# Patient Record
Sex: Female | Born: 1995 | Race: White | Hispanic: No | Marital: Married | Smoking: Never smoker
Health system: Southern US, Community
[De-identification: ages and names within clinical notes are randomized; demographics above are authoritative.]

## PROBLEM LIST (undated history)

## (undated) DIAGNOSIS — D649 Anemia, unspecified: Secondary | ICD-10-CM

## (undated) DIAGNOSIS — O139 Gestational [pregnancy-induced] hypertension without significant proteinuria, unspecified trimester: Secondary | ICD-10-CM

## (undated) DIAGNOSIS — F419 Anxiety disorder, unspecified: Secondary | ICD-10-CM

## (undated) DIAGNOSIS — N189 Chronic kidney disease, unspecified: Secondary | ICD-10-CM

## (undated) HISTORY — DX: Anxiety disorder, unspecified: F41.9

## (undated) HISTORY — PX: WISDOM TOOTH EXTRACTION: SHX21

## (undated) HISTORY — DX: Gestational (pregnancy-induced) hypertension without significant proteinuria, unspecified trimester: O13.9

---

## 2018-02-06 NOTE — L&D Delivery Note (Signed)
Operative Delivery Note At 11:10 PM a viable female was delivered via Vaginal, Vacuum Neurosurgeon).  Presentation: vertex; Position: Left,, Occiput,, Anterior; Station: +3.  Pushed > 2 hours with good effort. Became exhausted and requested help from below. We discussed OVD vs Cesarean delivery and she elected VAVD.  Verbal consent: obtained from patient.  Risks and benefits discussed in detail.  Risks include, but are not limited to the risks of anesthesia, bleeding, infection, damage to maternal tissues, fetal cephalhematoma.  There is also the risk of inability to effect vaginal delivery of the head, or shoulder dystocia that cannot be resolved by established maneuvers, leading to the need for emergency cesarean section.  Vertex gently pulled over three contractions.   APGAR: 7, 8 Weight pending Placenta status: routine Cord: WNL with the following complications: None Cord pH: not sent  Anesthesia:  CLE Instruments: Kiwi Episiotomy: None Lacerations: 2nd degree;Perineal Suture Repair: 2.0 3.0 vicryl Est. Blood Loss (mL):  200cc   "Suzanne Shaffer"!!   Mom to postpartum.  Baby to Couplet care / Skin to Skin.  [name] 12/26/2018, 11:27 PM

## 2018-06-14 LAB — OB RESULTS CONSOLE GC/CHLAMYDIA
Chlamydia: NEGATIVE
Gonorrhea: NEGATIVE

## 2018-06-14 LAB — OB RESULTS CONSOLE ABO/RH: RH Type: POSITIVE

## 2018-06-14 LAB — OB RESULTS CONSOLE RPR: RPR: NONREACTIVE

## 2018-06-14 LAB — OB RESULTS CONSOLE HIV ANTIBODY (ROUTINE TESTING): HIV: NONREACTIVE

## 2018-06-14 LAB — OB RESULTS CONSOLE ANTIBODY SCREEN: Antibody Screen: NEGATIVE

## 2018-06-14 LAB — OB RESULTS CONSOLE RUBELLA ANTIBODY, IGM: Rubella: IMMUNE

## 2018-06-14 LAB — OB RESULTS CONSOLE HEPATITIS B SURFACE ANTIGEN: Hepatitis B Surface Ag: NEGATIVE

## 2018-09-05 ENCOUNTER — Encounter (HOSPITAL_COMMUNITY): Payer: Self-pay | Admitting: *Deleted

## 2018-09-05 ENCOUNTER — Other Ambulatory Visit: Payer: Self-pay

## 2018-09-05 ENCOUNTER — Inpatient Hospital Stay (HOSPITAL_BASED_OUTPATIENT_CLINIC_OR_DEPARTMENT_OTHER): Payer: BC Managed Care – PPO

## 2018-09-05 ENCOUNTER — Inpatient Hospital Stay (HOSPITAL_COMMUNITY)
Admission: AD | Admit: 2018-09-05 | Discharge: 2018-09-05 | Disposition: A | Discharge status: Home / Self Care | Payer: BC Managed Care – PPO | Attending: Obstetrics and Gynecology | Admitting: Obstetrics and Gynecology

## 2018-09-05 DIAGNOSIS — O26892 Other specified pregnancy related conditions, second trimester: Secondary | ICD-10-CM

## 2018-09-05 DIAGNOSIS — M545 Low back pain: Secondary | ICD-10-CM | POA: Diagnosis present

## 2018-09-05 DIAGNOSIS — Z3A21 21 weeks gestation of pregnancy: Secondary | ICD-10-CM

## 2018-09-05 DIAGNOSIS — O9989 Other specified diseases and conditions complicating pregnancy, childbirth and the puerperium: Secondary | ICD-10-CM

## 2018-09-05 DIAGNOSIS — O98812 Other maternal infectious and parasitic diseases complicating pregnancy, second trimester: Secondary | ICD-10-CM | POA: Diagnosis not present

## 2018-09-05 DIAGNOSIS — R42 Dizziness and giddiness: Secondary | ICD-10-CM | POA: Diagnosis not present

## 2018-09-05 DIAGNOSIS — B373 Candidiasis of vulva and vagina: Secondary | ICD-10-CM | POA: Insufficient documentation

## 2018-09-05 DIAGNOSIS — B3731 Acute candidiasis of vulva and vagina: Secondary | ICD-10-CM

## 2018-09-05 DIAGNOSIS — M549 Dorsalgia, unspecified: Secondary | ICD-10-CM | POA: Diagnosis not present

## 2018-09-05 DIAGNOSIS — O99891 Other specified diseases and conditions complicating pregnancy: Secondary | ICD-10-CM

## 2018-09-05 DIAGNOSIS — N898 Other specified noninflammatory disorders of vagina: Secondary | ICD-10-CM

## 2018-09-05 HISTORY — DX: Anemia, unspecified: D64.9

## 2018-09-05 HISTORY — DX: Chronic kidney disease, unspecified: N18.9

## 2018-09-05 LAB — URINALYSIS, ROUTINE W REFLEX MICROSCOPIC
Bilirubin Urine: NEGATIVE
Glucose, UA: 50 mg/dL — AB
Hgb urine dipstick: NEGATIVE
Ketones, ur: NEGATIVE mg/dL
Leukocytes,Ua: NEGATIVE
Nitrite: NEGATIVE
Protein, ur: NEGATIVE mg/dL
Specific Gravity, Urine: 1.024 (ref 1.005–1.030)
pH: 7 (ref 5.0–8.0)

## 2018-09-05 LAB — WET PREP, GENITAL
Clue Cells Wet Prep HPF POC: NONE SEEN
Sperm: NONE SEEN
Trich, Wet Prep: NONE SEEN
Yeast Wet Prep HPF POC: NONE SEEN

## 2018-09-05 MED ORDER — IBUPROFEN 600 MG PO TABS
600.0000 mg | ORAL_TABLET | Freq: Once | ORAL | Status: AC
  Administered 2018-09-05: 600 mg via ORAL
  Filled 2018-09-05: qty 1

## 2018-09-05 MED ORDER — IBUPROFEN 600 MG PO TABS: 600.0000 mg | ORAL_TABLET | Freq: Four times a day (QID) | ORAL | 0 refills | Status: DC | PRN

## 2018-09-05 MED ORDER — TERCONAZOLE 0.4 % VA CREA: 1.0000 | TOPICAL_CREAM | Freq: Every day | VAGINAL | 0 refills | Status: DC

## 2018-09-05 NOTE — MAU Provider Note (Signed)
Chief Complaint: Vaginal Discharge, Back Pain, and Dizziness   First Provider Initiated Contact with Patient 09/05/18 1901     SUBJECTIVE HPI: Suzanne Shaffer is a 23 y.o. G1P0 at 6231w4d who presents to Maternity Admissions reporting worsening low back pain that "takes her breath away" since 7/27 and increased white vaginal discharge today. Recently started a new job. Also had a episode of dizziness and seeing spots after standing up quickly at work today.    Location: mid low back Quality: aching Severity: 9/10 on pain scale Duration: few weeks Course: Much worse over the past 4 days Context: [redacted] weeks gestation. Just started a new job. Timing: constant Modifying factors: Nothing. Hasn't tried anything for the pain.  Associated signs and symptoms: Pos for vaginal discharge.  Negative for urinary complaints, leaking of fluid, vaginal bleeding, GI complaints, contractions or abdominal pain.  Past Medical History:  Diagnosis Date  . Anemia   . Chronic kidney disease    Frequent UTI's   OB History  Gravida Para Term Preterm AB Living  1            SAB TAB Ectopic Multiple Live Births               # Outcome Date GA Lbr Len/2nd Weight Sex Delivery Anes PTL Lv  1 Current            Past Surgical History:  Procedure Laterality Date  . WISDOM TOOTH EXTRACTION Bilateral    Upper only   Social History   Socioeconomic History  . Marital status: Single    Spouse name: Not on file  . Number of children: Not on file  . Years of education: Not on file  . Highest education level: Not on file  Occupational History  . Not on file  Social Needs  . Financial resource strain: Not on file  . Food insecurity    Worry: Not on file    Inability: Not on file  . Transportation needs    Medical: Not on file    Non-medical: Not on file  Tobacco Use  . Smoking status: Never Smoker  . Smokeless tobacco: Never Used  Substance and Sexual Activity  . Alcohol use: Not Currently    Comment:  Occasional  . Drug use: Never  . Sexual activity: Yes  Lifestyle  . Physical activity    Days per week: Not on file    Minutes per session: Not on file  . Stress: Not on file  Relationships  . Social Musicianconnections    Talks on phone: Not on file    Gets together: Not on file    Attends religious service: Not on file    Active member of club or organization: Not on file    Attends meetings of clubs or organizations: Not on file    Relationship status: Not on file  . Intimate partner violence    Fear of current or ex partner: Not on file    Emotionally abused: Not on file    Physically abused: Not on file    Forced sexual activity: Not on file  Other Topics Concern  . Not on file  Social History Narrative  . Not on file   Family History  Problem Relation Age of Onset  . Healthy Mother   . Healthy Father    No current facility-administered medications on file prior to encounter.    Current Outpatient Medications on File Prior to Encounter  Medication Sig Dispense Refill  .  Prenatal Vit-Fe Fumarate-FA (MULTIVITAMIN-PRENATAL) 27-0.8 MG TABS tablet Take 1 tablet by mouth daily at 12 noon.     Allergies  Allergen Reactions  . Sulfa Antibiotics     I have reviewed patient's Past Medical Hx, Surgical Hx, Family Hx, Social Hx, medications and allergies.   Review of Systems  Constitutional: Negative for chills and fever.  Gastrointestinal: Negative for abdominal pain, constipation, diarrhea, nausea and vomiting.  Genitourinary: Positive for vaginal discharge. Negative for difficulty urinating, dysuria, flank pain, frequency, hematuria, pelvic pain, urgency, vaginal bleeding and vaginal pain.  Musculoskeletal: Positive for back pain. Negative for myalgias.  Neurological: Positive for dizziness (Resolved spontaneously). Negative for syncope.    OBJECTIVE Patient Vitals for the past 24 hrs:  BP Temp Pulse Resp SpO2 Weight  09/05/18 1749 122/84 98.3 F (36.8 C) (!) 109 18 100 %  72.7 kg  09/05/18 1746 - - - - 100 % -   Constitutional: Well-developed, well-nourished female in no acute distress.  Skin: No pallor Cardiovascular: Mild tachycardia Respiratory: normal rate and effort.  GI: Abd soft, non-tender, gravid appropriate for gestational age. Neurologic: Alert and oriented x 4.  GU: Neg CVAT.  SPECULUM EXAM: NEFG, moderate amount of thick, white, odorless discharge, negative pooling, no blood noted, cervix clean, visually closed, normal ectropion.  BIMANUAL: cervix closed/thick; uterus 22-week size, no adnexal tenderness or masses.  No CMT.  LAB RESULTS Results for orders placed or performed during the hospital encounter of 09/05/18 (from the past 24 hour(s))  Urinalysis, Routine w reflex microscopic     Status: Abnormal   Collection Time: 09/05/18  5:53 PM  Result Value Ref Range   Color, Urine YELLOW YELLOW   APPearance HAZY (A) CLEAR   Specific Gravity, Urine 1.024 1.005 - 1.030   pH 7.0 5.0 - 8.0   Glucose, UA 50 (A) NEGATIVE mg/dL   Hgb urine dipstick NEGATIVE NEGATIVE   Bilirubin Urine NEGATIVE NEGATIVE   Ketones, ur NEGATIVE NEGATIVE mg/dL   Protein, ur NEGATIVE NEGATIVE mg/dL   Nitrite NEGATIVE NEGATIVE   Leukocytes,Ua NEGATIVE NEGATIVE  Wet prep, genital     Status: Abnormal   Collection Time: 09/05/18  7:20 PM   Specimen: Cervix; Genital  Result Value Ref Range   Yeast Wet Prep HPF POC NONE SEEN NONE SEEN   Trich, Wet Prep NONE SEEN NONE SEEN   Clue Cells Wet Prep HPF POC NONE SEEN NONE SEEN   WBC, Wet Prep HPF POC MANY (A) NONE SEEN   Sperm NONE SEEN     IMAGING Prelim: Fetal heart rate 154.  CL 4.12 cm, subjectively Nml fluid, ovaries Nml.   MAU COURSE Orders Placed This Encounter  Procedures  . Wet prep, genital  . US MFM OB TRANSVAGINAL  . US MFM OB Limited  . Urinalysis, Routine w reflex microscopic  . Apply heat to affected area  . POCT fern test   Meds ordered this encounter  Medications  . ibuprofen (ADVIL) tablet  600 mg   Pain improved w/ IBU.   MDM - LBP likely MS pain. No evidence of preterm labor, pyelo, kidney stones or emergent condition.    -Brief episode of dizziness upon standing likely orthostatic.  Encouraged to increase fluids and have small, frequent snacks.  -Clinical diagnosis of vaginal yeast infection.  Rx Terazol.  ASSESSMENT 1. Back pain affecting pregnancy in second trimester   2. Vaginal discharge during pregnancy in second trimester   3. Musculoskeletal back pain   4. Dizziness  5. Vaginal yeast infection     PLAN Discharge home in stable condition. Second trimester precautions Comfort measures, apply heat, stretches discussed.  IBU may be used sparingly and not after 28 weeks.  Follow-up Information    [place] Follow up on 09/16/2018.   Why: as scheduled or sooner as needed if symptoms worsen Contact information: [place] [address] [phone]        [facility] Follow up.   Specialty: Obstetrics and Gynecology Why: As needed in pregnancy emergencies Contact information: [address] [id number] [place] [phone]         Allergies as of 09/05/2018      Reactions   Sulfa Antibiotics       Medication List    TAKE these medications   ibuprofen 600 MG tablet Commonly known as: ADVIL Take 1 tablet (600 mg total) by mouth every 6 (six) hours as needed for moderate pain. Use sparingly.  Do not use after [redacted] weeks gestation.   multivitamin-prenatal 27-0.8 MG Tabs tablet Take 1 tablet by mouth daily at 12 noon.   terconazole 0.4 % vaginal cream Commonly known as: Terazol 7 Place 1 applicator vaginally at bedtime.        Tamala Julian, [place], [place] 09/05/2018  8:46 PM

## 2018-09-05 NOTE — Discharge Instructions (Signed)

## 2018-09-05 NOTE — MAU Note (Signed)
Been having severe back pain.  Started on Monday.  Has been increasing.  When she lays down, it just takes her breath away.  At work today, she got really dizzy. Like her blood sugar dropped.  Was seeing black spots.   Went to bathroom later, had a lot of d/c- much more then usual, again this time- but not as much.  Hx of anemia. Still doesn't feel quit right.

## 2018-10-10 ENCOUNTER — Other Ambulatory Visit: Payer: Self-pay

## 2018-10-10 DIAGNOSIS — Z20822 Contact with and (suspected) exposure to covid-19: Secondary | ICD-10-CM

## 2018-10-11 LAB — NOVEL CORONAVIRUS, NAA: SARS-CoV-2, NAA: DETECTED — AB

## 2018-12-09 ENCOUNTER — Other Ambulatory Visit: Payer: Self-pay

## 2018-12-09 ENCOUNTER — Inpatient Hospital Stay (HOSPITAL_COMMUNITY)
Admission: AD | Admit: 2018-12-09 | Discharge: 2018-12-09 | Disposition: A | Discharge status: Home / Self Care | Payer: BC Managed Care – PPO | Attending: Obstetrics and Gynecology | Admitting: Obstetrics and Gynecology

## 2018-12-09 ENCOUNTER — Encounter (HOSPITAL_COMMUNITY): Payer: Self-pay

## 2018-12-09 DIAGNOSIS — O26893 Other specified pregnancy related conditions, third trimester: Secondary | ICD-10-CM | POA: Diagnosis present

## 2018-12-09 DIAGNOSIS — R519 Headache, unspecified: Secondary | ICD-10-CM | POA: Diagnosis not present

## 2018-12-09 DIAGNOSIS — O99013 Anemia complicating pregnancy, third trimester: Secondary | ICD-10-CM | POA: Diagnosis not present

## 2018-12-09 DIAGNOSIS — Z3689 Encounter for other specified antenatal screening: Secondary | ICD-10-CM | POA: Insufficient documentation

## 2018-12-09 DIAGNOSIS — N189 Chronic kidney disease, unspecified: Secondary | ICD-10-CM | POA: Diagnosis not present

## 2018-12-09 DIAGNOSIS — O26833 Pregnancy related renal disease, third trimester: Secondary | ICD-10-CM | POA: Insufficient documentation

## 2018-12-09 DIAGNOSIS — I129 Hypertensive chronic kidney disease with stage 1 through stage 4 chronic kidney disease, or unspecified chronic kidney disease: Secondary | ICD-10-CM | POA: Diagnosis not present

## 2018-12-09 DIAGNOSIS — D649 Anemia, unspecified: Secondary | ICD-10-CM | POA: Insufficient documentation

## 2018-12-09 DIAGNOSIS — Z3A35 35 weeks gestation of pregnancy: Secondary | ICD-10-CM

## 2018-12-09 DIAGNOSIS — R030 Elevated blood-pressure reading, without diagnosis of hypertension: Secondary | ICD-10-CM | POA: Diagnosis not present

## 2018-12-09 LAB — COMPREHENSIVE METABOLIC PANEL
ALT: 15 U/L (ref 0–44)
AST: 16 U/L (ref 15–41)
Albumin: 2.8 g/dL — ABNORMAL LOW (ref 3.5–5.0)
Alkaline Phosphatase: 91 U/L (ref 38–126)
Anion gap: 10 (ref 5–15)
BUN: <5 mg/dL — ABNORMAL LOW (ref 6–20)
CO2: 21 mmol/L — ABNORMAL LOW (ref 22–32)
Calcium: 8.5 mg/dL — ABNORMAL LOW (ref 8.9–10.3)
Chloride: 105 mmol/L (ref 98–111)
Creatinine, Ser: 0.42 mg/dL — ABNORMAL LOW (ref 0.44–1.00)
GFR calc Af Amer: >60 mL/min (ref >60)
GFR calc non Af Amer: >60 mL/min (ref >60)
Glucose, Bld: 80 mg/dL (ref 70–99)
Potassium: 3.5 mmol/L (ref 3.5–5.1)
Sodium: 136 mmol/L (ref 135–145)
Total Bilirubin: 0.3 mg/dL (ref 0.3–1.2)
Total Protein: 6.4 g/dL — ABNORMAL LOW (ref 6.5–8.1)

## 2018-12-09 LAB — CBC
HCT: 33.7 % — ABNORMAL LOW (ref 36.0–46.0)
Hemoglobin: 10.7 g/dL — ABNORMAL LOW (ref 12.0–15.0)
MCH: 25.6 pg — ABNORMAL LOW (ref 26.0–34.0)
MCHC: 31.8 g/dL (ref 30.0–36.0)
MCV: 80.6 fL (ref 80.0–100.0)
Platelets: 237 10*3/uL (ref 150–400)
RBC: 4.18 MIL/uL (ref 3.87–5.11)
RDW: 15.7 % — ABNORMAL HIGH (ref 11.5–15.5)
WBC: 7.9 10*3/uL (ref 4.0–10.5)
nRBC: 0 % (ref 0.0–0.2)

## 2018-12-09 LAB — PROTEIN / CREATININE RATIO, URINE
Creatinine, Urine: 162.78 mg/dL
Protein Creatinine Ratio: 0.09 mg/mg{Cre} (ref 0.00–0.15)
Total Protein, Urine: 15 mg/dL

## 2018-12-09 LAB — URINALYSIS, ROUTINE W REFLEX MICROSCOPIC
Bilirubin Urine: NEGATIVE
Glucose, UA: 50 mg/dL — AB
Hgb urine dipstick: NEGATIVE
Ketones, ur: NEGATIVE mg/dL
Nitrite: NEGATIVE
Protein, ur: NEGATIVE mg/dL
Specific Gravity, Urine: 1.021 (ref 1.005–1.030)
pH: 5 (ref 5.0–8.0)

## 2018-12-09 MED ORDER — CYCLOBENZAPRINE HCL 10 MG PO TABS
10.0000 mg | ORAL_TABLET | Freq: Once | ORAL | Status: AC
  Administered 2018-12-09: 10 mg via ORAL
  Filled 2018-12-09: qty 1

## 2018-12-09 MED ORDER — CYCLOBENZAPRINE HCL 10 MG PO TABS: 10.0000 mg | ORAL_TABLET | Freq: Three times a day (TID) | ORAL | 0 refills | Status: DC | PRN

## 2018-12-09 NOTE — MAU Provider Note (Signed)
History     CSN: [id number]  Arrival date and time: 12/09/18 1340   First Provider Initiated Contact with Patient 12/09/18 1425      Chief Complaint  Patient presents with  . Hypertension  . Spots and/or Floaters   Ms. Suzanne Shaffer is a 23 y.o. G1P0 at [redacted]w[redacted]d who presents to MAU for preeclampsia evaluation after she experienced blurry vision and spots at work today. Pt reports her BP was checked at work today and it "wasn't normal for her" and was instructed by her OB to come to MAU for evaluation. Pt reports at work it was 140s/60s. Pt denies blurry vision at this time, but states that "if I move really fast I can see spots." Pt denies issues with elevated BP outside of pregnancy, but reports she did have an elevated BP at her last office visit for the first time. Pt unsure what the reading was at that visit. Pt reports before this occurred she ate eggs and PB crackers and apples. Pt reports she had two bottles of water this morning. Pt reports this is a normal amount of food/drink for her.  Pt reports she currently has a HA and rates it as 5/10. Pt reports she took Tylenol (one extra-strength tablet) today around 1000AM this morning, but reports this did not help her HA. Pt reports a HA of migraines as a child. Pt reports an increase in HA during this pregnancy.  Pt denies blurry vision/seeing spots, N/V, epigastric pain, swelling in face and hands, sudden weight gain. Pt denies chest pain and SOB.  Pt denies constipation, diarrhea, or urinary problems. Pt denies fever, chills, fatigue, sweating or changes in appetite. Pt denies dizziness, light-headedness, weakness.  Pt denies VB, ctx, LOF and reports good FM.  Current pregnancy problems? anemia, pt reports elevated BP at last visit Blood Type? unknown Allergies? sulfa Current medications? PNVs, Fe Current P[facility] & next appt? [facility], next appt 12/12/2018   OB History    Gravida  1   Para      Term      Preterm       AB      Living        SAB      TAB      Ectopic      Multiple      Live Births              Past Medical History:  Diagnosis Date  . Anemia   . Chronic kidney disease    Frequent UTI's    Past Surgical History:  Procedure Laterality Date  . WISDOM TOOTH EXTRACTION Bilateral    Upper only    Family History  Problem Relation Age of Onset  . Healthy Mother   . Healthy Father     Social History   Tobacco Use  . Smoking status: Never Smoker  . Smokeless tobacco: Never Used  Substance Use Topics  . Alcohol use: Not Currently    Comment: Occasional  . Drug use: Never    Allergies:  Allergies  Allergen Reactions  . Sulfa Antibiotics     Medications Prior to Admission  Medication Sig Dispense Refill Last Dose  . ferrous sulfate 325 (65 FE) MG tablet Take 325 mg by mouth daily with breakfast.   12/08/2018 at Unknown time  . Prenatal Vit-Fe Fumarate-FA (MULTIVITAMIN-PRENATAL) 27-0.8 MG TABS tablet Take 1 tablet by mouth daily at 12 noon.   12/08/2018 at Unknown time  . ibuprofen (  ADVIL) 600 MG tablet Take 1 tablet (600 mg total) by mouth every 6 (six) hours as needed for moderate pain. Use sparingly.  Do not use after [redacted] weeks gestation. 30 tablet 0   . terconazole (TERAZOL 7) 0.4 % vaginal cream Place 1 applicator vaginally at bedtime. 45 g 0     Review of Systems  Constitutional: Negative for chills, diaphoresis, fatigue and fever.  Eyes: Negative for visual disturbance.  Respiratory: Negative for shortness of breath.   Cardiovascular: Negative for chest pain.  Gastrointestinal: Negative for abdominal pain, constipation, diarrhea, nausea and vomiting.  Genitourinary: Negative for dysuria, flank pain, frequency, pelvic pain, urgency, vaginal bleeding and vaginal discharge.  Neurological: Positive for headaches. Negative for dizziness, weakness and light-headedness.   Physical Exam   Blood pressure 135/74, pulse 95, temperature 98.1 F (36.7 C),  temperature source Oral, resp. rate 17, height 5\' 7"  (1.702 m), weight 82.7 kg, SpO2 99 %.  Patient Vitals for the past 24 hrs:  BP Temp Temp src Pulse Resp SpO2 Height Weight  12/09/18 1530 135/74 - - 95 - - - -  12/09/18 1515 134/81 - - (!) 101 - - - -  12/09/18 1500 138/90 - - (!) 108 - - - -  12/09/18 1445 140/87 - - (!) 116 - - - -  12/09/18 1430 132/85 - - 98 - 99 % - -  12/09/18 1415 133/86 - - 99 - 98 % - -  12/09/18 1407 137/84 98.1 F (36.7 C) Oral (!) 101 17 - 5\' 7"  (1.702 m) 82.7 kg   Physical Exam  Constitutional: She is oriented to person, place, and time. She appears well-developed and well-nourished. No distress.  HENT:  Head: Normocephalic and atraumatic.  Respiratory: Effort normal.  GI: Soft. She exhibits no distension and no mass. There is no abdominal tenderness. There is no rebound and no guarding.  Neurological: She is alert and oriented to person, place, and time.  Skin: Skin is warm and dry. She is not diaphoretic.  Psychiatric: She has a normal mood and affect. Her behavior is normal. Judgment and thought content normal.   Results for orders placed or performed during the hospital encounter of 12/09/18 (from the past 24 hour(s))  Urinalysis, Routine w reflex microscopic     Status: Abnormal   Collection Time: 12/09/18  2:21 PM  Result Value Ref Range   Color, Urine YELLOW YELLOW   APPearance HAZY (A) CLEAR   Specific Gravity, Urine 1.021 1.005 - 1.030   pH 5.0 5.0 - 8.0   Glucose, UA 50 (A) NEGATIVE mg/dL   Hgb urine dipstick NEGATIVE NEGATIVE   Bilirubin Urine NEGATIVE NEGATIVE   Ketones, ur NEGATIVE NEGATIVE mg/dL   Protein, ur NEGATIVE NEGATIVE mg/dL   Nitrite NEGATIVE NEGATIVE   Leukocytes,Ua TRACE (A) NEGATIVE   RBC / HPF 0-5 0 - 5 RBC/hpf   WBC, UA 0-5 0 - 5 WBC/hpf   Bacteria, UA RARE (A) NONE SEEN   Squamous Epithelial / LPF 11-20 0 - 5   Mucus PRESENT   Protein / creatinine ratio, urine     Status: None   Collection Time: 12/09/18  2:23  PM  Result Value Ref Range   Creatinine, Urine 162.78 mg/dL   Total Protein, Urine 15 mg/dL   Protein Creatinine Ratio 0.09 0.00 - 0.15 mg/mg[Cre]  CBC     Status: Abnormal   Collection Time: 12/09/18  2:38 PM  Result Value Ref Range   WBC 7.9 4.0 -  10.5 K/uL   RBC 4.18 3.87 - 5.11 MIL/uL   Hemoglobin 10.7 (L) 12.0 - 15.0 g/dL   HCT 29.533.7 (L) 62.136.0 - 30.846.0 %   MCV 80.6 80.0 - 100.0 fL   MCH 25.6 (L) 26.0 - 34.0 pg   MCHC 31.8 30.0 - 36.0 g/dL   RDW 65.715.7 (H) 84.611.5 - 96.215.5 %   Platelets 237 150 - 400 K/uL   nRBC 0.0 0.0 - 0.2 %  Comprehensive metabolic panel     Status: Abnormal   Collection Time: 12/09/18  2:38 PM  Result Value Ref Range   Sodium 136 135 - 145 mmol/L   Potassium 3.5 3.5 - 5.1 mmol/L   Chloride 105 98 - 111 mmol/L   CO2 21 (L) 22 - 32 mmol/L   Glucose, Bld 80 70 - 99 mg/dL   BUN <5 (L) 6 - 20 mg/dL   Creatinine, Ser 9.520.42 (L) 0.44 - 1.00 mg/dL   Calcium 8.5 (L) 8.9 - 10.3 mg/dL   Total Protein 6.4 (L) 6.5 - 8.1 g/dL   Albumin 2.8 (L) 3.5 - 5.0 g/dL   AST 16 15 - 41 U/L   ALT 15 0 - 44 U/L   Alkaline Phosphatase 91 38 - 126 U/L   Total Bilirubin 0.3 0.3 - 1.2 mg/dL   GFR calc non Af Amer >60 >60 mL/min   GFR calc Af Amer >60 >60 mL/min   Anion gap 10 5 - 15   No results found.  MAU Course  Procedures  MDM -preeclampsia evaluation with current 5/10 HA and hx of migraines -no prenatal records scanned into chart from OB -highest BP in MAU 140/87, normal exam -UA: hazy/50GLU/trace leuks/rare bacteria, sending urine for culture -CBC: H/H 10.7/33.7 (pt reports is on oral iron), platelets 237 -CMP: no abnormalities requiring treatment, AST/ALT 16/15 -PCr: 0.09 -Flexeril 10mg  given for HA, pt reports HA now 0/10 -EFM: reactive       -baseline: 140/150       -variability: moderate       -accels: present, 15x15       -decels: absent       -TOCO: no ctx -pt discharged to home in stable condition  Orders Placed This Encounter  Procedures  . Urinalysis,  Routine w reflex microscopic    Standing Status:   Standing    Number of Occurrences:   1  . CBC    Standing Status:   Standing    Number of Occurrences:   1  . Comprehensive metabolic panel    Standing Status:   Standing    Number of Occurrences:   1  . Protein / creatinine ratio, urine    Standing Status:   Standing    Number of Occurrences:   1  . Discharge patient    Order Specific Question:   Discharge disposition    Answer:   01-Home or Self Care [1]    Order Specific Question:   Discharge patient date    Answer:   12/09/2018   Meds ordered this encounter  Medications  . cyclobenzaprine (FLEXERIL) tablet 10 mg  . cyclobenzaprine (FLEXERIL) 10 MG tablet    Sig: Take 1 tablet (10 mg total) by mouth 3 (three) times daily as needed.    Dispense:  30 tablet    Refill:  0    Order Specific Question:   Supervising Provider    Answer:   Adam PhenixARNOLD, JAMES G [[id number]]   Assessment and Plan   1. Transient  elevated blood pressure   2. [redacted] weeks gestation of pregnancy   3. NST (non-stress test) reactive   4. Pregnancy headache in third trimester    Allergies as of 12/09/2018      Reactions   Sulfa Antibiotics       Medication List    STOP taking these medications   ibuprofen 600 MG tablet Commonly known as: ADVIL     TAKE these medications   cyclobenzaprine 10 MG tablet Commonly known as: FLEXERIL Take 1 tablet (10 mg total) by mouth 3 (three) times daily as needed.   ferrous sulfate 325 (65 FE) MG tablet Take 325 mg by mouth daily with breakfast.   multivitamin-prenatal 27-0.8 MG Tabs tablet Take 1 tablet by mouth daily at 12 noon.   terconazole 0.4 % vaginal cream Commonly known as: Terazol 7 Place 1 applicator vaginally at bedtime.      -will call with culture results, if positive -RX Flexeril -pt to have BP checked at appt on Thursday 12/12/2018, pt strongly advised to keep appt -strict preeclampsia/return MAU precautions given -pt discharged to home in stable  condition  Joni Reining E Vonita Calloway 12/09/2018, 3:49 PM

## 2018-12-09 NOTE — Discharge Instructions (Signed)
Preterm Labor and Birth Information ° °The normal length of a pregnancy is 39-41 weeks. Preterm labor is when labor starts before 37 completed weeks of pregnancy. °What are the risk factors for preterm labor? °Preterm labor is more likely to occur in women who: °· Have certain infections during pregnancy such as a bladder infection, sexually transmitted infection, or infection inside the uterus (chorioamnionitis). °· Have a shorter-than-normal cervix. °· Have gone into preterm labor before. °· Have had surgery on their cervix. °· Are younger than age 17 or older than age 35. °· Are African American. °· Are pregnant with twins or multiple babies (multiple gestation). °· Take street drugs or smoke while pregnant. °· Do not gain enough weight while pregnant. °· Became pregnant shortly after having been pregnant. °What are the symptoms of preterm labor? °Symptoms of preterm labor include: °· Cramps similar to those that can happen during a menstrual period. The cramps may happen with diarrhea. °· Pain in the abdomen or lower back. °· Regular uterine contractions that may feel like tightening of the abdomen. °· A feeling of increased pressure in the pelvis. °· Increased watery or bloody mucus discharge from the vagina. °· Water breaking (ruptured amniotic sac). °Why is it important to recognize signs of preterm labor? °It is important to recognize signs of preterm labor because babies who are born prematurely may not be fully developed. This can put them at an increased risk for: °· Long-term (chronic) heart and lung problems. °· Difficulty immediately after birth with regulating body systems, including blood sugar, body temperature, heart rate, and breathing rate. °· Bleeding in the brain. °· Cerebral palsy. °· Learning difficulties. °· Death. °These risks are highest for babies who are born before 34 weeks of pregnancy. °How is preterm labor treated? °Treatment depends on the length of your pregnancy, your condition,  and the health of your baby. It may involve: °· Having a stitch (suture) placed in your cervix to prevent your cervix from opening too early (cerclage). °· Taking or being given medicines, such as: °? Hormone medicines. These may be given early in pregnancy to help support the pregnancy. °? Medicine to stop contractions. °? Medicines to help mature the baby’s lungs. These may be prescribed if the risk of delivery is high. °? Medicines to prevent your baby from developing cerebral palsy. °If the labor happens before 34 weeks of pregnancy, you may need to stay in the hospital. °What should I do if I think I am in preterm labor? °If you think that you are going into preterm labor, call your health care provider right away. °How can I prevent preterm labor in future pregnancies? °To increase your chance of having a full-term pregnancy: °· Do not use any tobacco products, such as cigarettes, chewing tobacco, and e-cigarettes. If you need help quitting, ask your health care provider. °· Do not use street drugs or medicines that have not been prescribed to you during your pregnancy. °· Talk with your health care provider before taking any herbal supplements, even if you have been taking them regularly. °· Make sure you gain a healthy amount of weight during your pregnancy. °· Watch for infection. If you think that you might have an infection, get it checked right away. °· Make sure to tell your health care provider if you have gone into preterm labor before. °This information is not intended to replace advice given to you by your health care provider. Make sure you discuss any questions you have with your   health care provider. °Document Released: 04/15/2003 Document Revised: 05/17/2018 Document Reviewed: 06/16/2015 °Elsevier Patient Education © 2020 [employer] ° °Preeclampsia and Eclampsia °Preeclampsia is a serious condition that may develop during pregnancy. This condition causes high blood pressure and increased  protein in your urine along with other symptoms, such as headaches and vision changes. These symptoms may develop as the condition gets worse. Preeclampsia may occur at 20 weeks of pregnancy or later. °Diagnosing and treating preeclampsia early is very important. If not treated early, it can cause serious problems for you and your baby. One problem it can lead to is eclampsia. Eclampsia is a condition that causes muscle jerking or shaking (convulsions or seizures) and other serious problems for the mother. During pregnancy, delivering your baby may be the best treatment for preeclampsia or eclampsia. For most women, preeclampsia and eclampsia symptoms go away after giving birth. °In rare cases, a woman may develop preeclampsia after giving birth (postpartum preeclampsia). This usually occurs within 48 hours after childbirth but may occur up to 6 weeks after giving birth. °What are the causes? °The cause of preeclampsia is not known. °What increases the risk? °The following risk factors make you more likely to develop preeclampsia: °· Being pregnant for the first time. °· Having had preeclampsia during a past pregnancy. °· Having a family history of preeclampsia. °· Having high blood pressure. °· Being pregnant with more than one baby. °· Being 35 or older. °· Being African-American. °· Having kidney disease or diabetes. °· Having medical conditions such as lupus or blood diseases. °· Being very overweight (obese). °What are the signs or symptoms? °The most common symptoms are: °· Severe headaches. °· Vision problems, such as blurred or double vision. °· Abdominal pain, especially upper abdominal pain. °Other symptoms that may develop as the condition gets worse include: °· Sudden weight gain. °· Sudden swelling of the hands, face, legs, and feet. °· Severe nausea and vomiting. °· Numbness in the face, arms, legs, and feet. °· Dizziness. °· Urinating less than usual. °· Slurred speech. °· Convulsions or  seizures. °How is this diagnosed? °There are no screening tests for preeclampsia. Your health care provider will ask you about symptoms and check for signs of preeclampsia during your prenatal visits. You may also have tests that include: °· Checking your blood pressure. °· Urine tests to check for protein. Your health care provider will check for this at every prenatal visit. °· Blood tests. °· Monitoring your baby's heart rate. °· Ultrasound. °How is this treated? °You and your health care provider will determine the treatment approach that is best for you. Treatment may include: °· Having more frequent prenatal exams to check for signs of preeclampsia, if you have an increased risk for preeclampsia. °· Medicine to lower your blood pressure. °· Staying in the hospital, if your condition is severe. There, treatment will focus on controlling your blood pressure and the amount of fluids in your body (fluid retention). °· Taking medicine (magnesium sulfate) to prevent seizures. This may be given as an injection or through an IV. °· Taking a low-dose aspirin during your pregnancy. °· Delivering your baby early. You may have your labor started with medicine (induced), or you may have a cesarean delivery. °Follow these instructions at home: °Eating and drinking ° °· Drink enough fluid to keep your urine pale yellow. °· Avoid caffeine. °Lifestyle °· Do not use any products that contain nicotine or tobacco, such as cigarettes and e-cigarettes. If you need help quitting, ask   health care provider.  Do not use alcohol or drugs.  Avoid stress as much as possible. Rest and get plenty of sleep. General instructions  Take over-the-counter and prescription medicines only as told by your health care provider.  When lying down, lie on your left side. This keeps pressure off your major blood vessels.  When sitting or lying down, raise (elevate) your feet. Try putting some pillows underneath your lower  legs.  Exercise regularly. Ask your health care provider what kinds of exercise are best for you.  Keep all follow-up and prenatal visits as told by your health care provider. This is important. How is this prevented? There is no known way of preventing preeclampsia or eclampsia from developing. However, to lower your risk of complications and detect problems early:  Get regular prenatal care. Your health care provider may be able to diagnose and treat the condition early.  Maintain a healthy weight. Ask your health care provider for help managing weight gain during pregnancy.  Work with your health care provider to manage any long-term (chronic) health conditions you have, such as diabetes or kidney problems.  You may have tests of your blood pressure and kidney function after giving birth.  Your health care provider may have you take low-dose aspirin during your next pregnancy. Contact a health care provider if:  You have symptoms that your health care provider told you may require more treatment or monitoring, such as: ? Headaches. ? Nausea or vomiting. ? Abdominal pain. ? Dizziness. ? Light-headedness. Get help right away if:  You have severe: ? Abdominal pain. ? Headaches that do not get better. ? Dizziness. ? Vision problems. ? Confusion. ? Nausea or vomiting.  You have any of the following: ? A seizure. ? Sudden, rapid weight gain. ? Sudden swelling in your hands, ankles, or face. ? Trouble moving any part of your body. ? Numbness in any part of your body. ? Trouble speaking. ? Abnormal bleeding.  You faint. Summary  Preeclampsia is a serious condition that may develop during pregnancy.  This condition causes high blood pressure and increased protein in your urine along with other symptoms, such as headaches and vision changes.  Diagnosing and treating preeclampsia early is very important. If not treated early, it can cause serious problems for you and your  baby.  Get help right away if you have symptoms that your health care provider told you to watch for. This information is not intended to replace advice given to you by your health care provider. Make sure you discuss any questions you have with your health care provider. Document Released: 01/21/2000 Document Revised: 09/25/2017 Document Reviewed: 08/30/2015 Elsevier Patient Education  2020 [employer].

## 2018-12-09 NOTE — MAU Note (Signed)
.  Suzanne Shaffer is a 23 y.o. at [redacted]w[redacted]d here in MAU reporting: elevated BP at work at 1300 with floaters in vision. She also reports swelling and a HA that is 6/10 after tylenol. No VB or LOF and feels fetal movement.  BP: 137/84 FHT:145

## 2018-12-12 LAB — OB RESULTS CONSOLE GBS: GBS: NEGATIVE

## 2018-12-23 ENCOUNTER — Telehealth (HOSPITAL_COMMUNITY): Payer: Self-pay | Admitting: *Deleted

## 2018-12-23 ENCOUNTER — Encounter (HOSPITAL_COMMUNITY): Payer: Self-pay | Admitting: *Deleted

## 2018-12-23 NOTE — Telephone Encounter (Signed)
Preadmission screen  

## 2018-12-24 ENCOUNTER — Other Ambulatory Visit (HOSPITAL_COMMUNITY): Payer: BC Managed Care – PPO

## 2018-12-25 NOTE — H&P (Signed)
Suzanne Shaffer is a 23 y.o. female presenting for IOL s/s gestational HTN w/out severe features. Pregnancy uncomplicated otherwise. Expecting a girl - "Suzanne Shaffer"!!   OB History    Gravida  1   Para      Term      Preterm      AB      Living        SAB      TAB      Ectopic      Multiple      Live Births             Past Medical History:  Diagnosis Date  . Anemia   . Anxiety   . Chronic kidney disease    Frequent UTI's  . Pregnancy induced hypertension    Past Surgical History:  Procedure Laterality Date  . Shaffer TOOTH EXTRACTION Bilateral    Upper only   Family History: family history includes Healthy in her father and mother. Social History:  reports that she has never smoked. She has never used smokeless tobacco. She reports previous alcohol use. She reports that she does not use drugs.     Maternal Diabetes: No Genetic Screening: Normal Maternal Ultrasounds/Referrals: Normal Fetal Ultrasounds or other Referrals:  None Maternal Substance Abuse:  No Significant Maternal Medications:  None Significant Maternal Lab Results:  None Other Comments:  None  ROS History   There were no vitals taken for this visit. Exam Physical Exam  (from office) NAD, A&O NWOB Abd soft, nondistended, gravid SVE 1/60/-2  Prenatal labs: ABO, Rh: A/Positive/-- (05/08 0000) Antibody: Negative (05/08 0000) Rubella: Immune (05/08 0000) RPR: Nonreactive (05/08 0000)  HBsAg: Negative (05/08 0000)  HIV: Non-reactive (05/08 0000)  GBS: Negative/-- (11/05 0000)   Assessment/Plan: 23 yo G1P0 @ 37.4 wga presenting for IOL s/s PIH w/out severe features. Cervix unfavorable. Plan for cytotec followed by pitocin/AROM when more favorable.  No severe features - Mag prn severe features.  GBS negative.     Colin Benton Seraj Dunnam 12/25/2018, 1:29 PM

## 2018-12-26 ENCOUNTER — Inpatient Hospital Stay (HOSPITAL_COMMUNITY)
Admission: AD | Admit: 2018-12-26 | Discharge: 2018-12-28 | DRG: 807 | Disposition: A | Discharge status: Home / Self Care | Payer: BC Managed Care – PPO | Attending: Obstetrics and Gynecology | Admitting: Obstetrics and Gynecology

## 2018-12-26 ENCOUNTER — Other Ambulatory Visit: Payer: Self-pay

## 2018-12-26 ENCOUNTER — Inpatient Hospital Stay (HOSPITAL_COMMUNITY): Payer: BC Managed Care – PPO

## 2018-12-26 ENCOUNTER — Inpatient Hospital Stay (HOSPITAL_COMMUNITY): Payer: BC Managed Care – PPO | Admitting: Anesthesiology

## 2018-12-26 ENCOUNTER — Encounter (HOSPITAL_COMMUNITY): Payer: Self-pay

## 2018-12-26 DIAGNOSIS — O134 Gestational [pregnancy-induced] hypertension without significant proteinuria, complicating childbirth: Secondary | ICD-10-CM | POA: Diagnosis present

## 2018-12-26 DIAGNOSIS — Z3A37 37 weeks gestation of pregnancy: Secondary | ICD-10-CM | POA: Diagnosis not present

## 2018-12-26 DIAGNOSIS — O9902 Anemia complicating childbirth: Secondary | ICD-10-CM | POA: Diagnosis present

## 2018-12-26 DIAGNOSIS — D649 Anemia, unspecified: Secondary | ICD-10-CM | POA: Diagnosis present

## 2018-12-26 DIAGNOSIS — Z3490 Encounter for supervision of normal pregnancy, unspecified, unspecified trimester: Secondary | ICD-10-CM

## 2018-12-26 LAB — CBC
HCT: 33.8 % — ABNORMAL LOW (ref 36.0–46.0)
HCT: 33.3 % — ABNORMAL LOW (ref 36.0–46.0)
Hemoglobin: 10.6 g/dL — ABNORMAL LOW (ref 12.0–15.0)
Hemoglobin: 10.7 g/dL — ABNORMAL LOW (ref 12.0–15.0)
MCH: 25.3 pg — ABNORMAL LOW (ref 26.0–34.0)
MCH: 25.7 pg — ABNORMAL LOW (ref 26.0–34.0)
MCHC: 31.4 g/dL (ref 30.0–36.0)
MCHC: 32.1 g/dL (ref 30.0–36.0)
MCV: 80.7 fL (ref 80.0–100.0)
MCV: 79.9 fL — ABNORMAL LOW (ref 80.0–100.0)
Platelets: 241 10*3/uL (ref 150–400)
Platelets: 153 10*3/uL (ref 150–400)
RBC: 4.19 MIL/uL (ref 3.87–5.11)
RBC: 4.17 MIL/uL (ref 3.87–5.11)
RDW: 15.7 % — ABNORMAL HIGH (ref 11.5–15.5)
RDW: 15.8 % — ABNORMAL HIGH (ref 11.5–15.5)
WBC: 8.2 10*3/uL (ref 4.0–10.5)
WBC: 8.3 10*3/uL (ref 4.0–10.5)
nRBC: 0 % (ref 0.0–0.2)
nRBC: 0 % (ref 0.0–0.2)

## 2018-12-26 LAB — TYPE AND SCREEN
ABO/RH(D): A POS
Antibody Screen: NEGATIVE

## 2018-12-26 LAB — RPR: RPR Ser Ql: NONREACTIVE

## 2018-12-26 LAB — ABO/RH: ABO/RH(D): A POS

## 2018-12-26 MED ORDER — TERBUTALINE SULFATE 1 MG/ML IJ SOLN: 0.2500 mg | Freq: Once | INTRAMUSCULAR | Status: DC | PRN

## 2018-12-26 MED ORDER — EPHEDRINE 5 MG/ML INJ: 10.0000 mg | INTRAVENOUS | Status: DC | PRN

## 2018-12-26 MED ORDER — FENTANYL-BUPIVACAINE-NACL 0.5-0.125-0.9 MG/250ML-% EP SOLN: 12.0000 mL/h | EPIDURAL | Status: DC | PRN

## 2018-12-26 MED ORDER — LACTATED RINGERS IV SOLN: 500.0000 mL | INTRAVENOUS | Status: DC | PRN

## 2018-12-26 MED ORDER — LACTATED RINGERS IV SOLN
500.0000 mL | Freq: Once | INTRAVENOUS | Status: AC
  Administered 2018-12-26: 500 mL via INTRAVENOUS

## 2018-12-26 MED ORDER — HYDROXYZINE HCL 50 MG PO TABS: 50.0000 mg | ORAL_TABLET | Freq: Four times a day (QID) | ORAL | Status: DC | PRN

## 2018-12-26 MED ORDER — ZOLPIDEM TARTRATE 5 MG PO TABS: 5.0000 mg | ORAL_TABLET | Freq: Every evening | ORAL | Status: DC | PRN

## 2018-12-26 MED ORDER — FENTANYL-BUPIVACAINE-NACL 0.5-0.125-0.9 MG/250ML-% EP SOLN
EPIDURAL | Status: AC
  Filled 2018-12-26: qty 250

## 2018-12-26 MED ORDER — OXYCODONE-ACETAMINOPHEN 5-325 MG PO TABS: 1.0000 | ORAL_TABLET | ORAL | Status: DC | PRN

## 2018-12-26 MED ORDER — PHENYLEPHRINE 40 MCG/ML (10ML) SYRINGE FOR IV PUSH (FOR BLOOD PRESSURE SUPPORT): 80.0000 ug | PREFILLED_SYRINGE | INTRAVENOUS | Status: DC | PRN

## 2018-12-26 MED ORDER — OXYCODONE-ACETAMINOPHEN 5-325 MG PO TABS: 2.0000 | ORAL_TABLET | ORAL | Status: DC | PRN

## 2018-12-26 MED ORDER — LACTATED RINGERS IV SOLN
INTRAVENOUS | Status: DC
  Administered 2018-12-26 (×3): via INTRAVENOUS

## 2018-12-26 MED ORDER — BUTORPHANOL TARTRATE 1 MG/ML IJ SOLN: 1.0000 mg | INTRAMUSCULAR | Status: DC | PRN

## 2018-12-26 MED ORDER — OXYTOCIN 40 UNITS IN NORMAL SALINE INFUSION - SIMPLE MED
2.5000 [IU]/h | INTRAVENOUS | Status: DC
  Administered 2018-12-26: 2.5 [IU]/h via INTRAVENOUS

## 2018-12-26 MED ORDER — ACETAMINOPHEN 325 MG PO TABS: 650.0000 mg | ORAL_TABLET | ORAL | Status: DC | PRN

## 2018-12-26 MED ORDER — LACTATED RINGERS IV SOLN: 500.0000 mL | Freq: Once | INTRAVENOUS | Status: DC

## 2018-12-26 MED ORDER — SODIUM CHLORIDE (PF) 0.9 % IJ SOLN
INTRAMUSCULAR | Status: DC | PRN
  Administered 2018-12-26: 12 mL/h via EPIDURAL

## 2018-12-26 MED ORDER — DIPHENHYDRAMINE HCL 50 MG/ML IJ SOLN: 12.5000 mg | INTRAMUSCULAR | Status: DC | PRN

## 2018-12-26 MED ORDER — MISOPROSTOL 25 MCG QUARTER TABLET
25.0000 ug | ORAL_TABLET | ORAL | Status: DC | PRN
  Administered 2018-12-26 (×2): 25 ug via VAGINAL
  Filled 2018-12-26 (×2): qty 1

## 2018-12-26 MED ORDER — SOD CITRATE-CITRIC ACID 500-334 MG/5ML PO SOLN: 30.0000 mL | ORAL | Status: DC | PRN

## 2018-12-26 MED ORDER — OXYTOCIN 40 UNITS IN NORMAL SALINE INFUSION - SIMPLE MED
1.0000 m[IU]/min | INTRAVENOUS | Status: DC
  Filled 2018-12-26: qty 1000

## 2018-12-26 MED ORDER — LIDOCAINE HCL (PF) 1 % IJ SOLN
INTRAMUSCULAR | Status: DC | PRN
  Administered 2018-12-26: 6 mL via EPIDURAL

## 2018-12-26 MED ORDER — OXYTOCIN BOLUS FROM INFUSION
500.0000 mL | Freq: Once | INTRAVENOUS | Status: AC
  Administered 2018-12-26: 500 mL via INTRAVENOUS

## 2018-12-26 MED ORDER — LIDOCAINE HCL (PF) 1 % IJ SOLN: 30.0000 mL | INTRAMUSCULAR | Status: DC | PRN

## 2018-12-26 MED ORDER — ONDANSETRON HCL 4 MG/2ML IJ SOLN: 4.0000 mg | Freq: Four times a day (QID) | INTRAMUSCULAR | Status: DC | PRN

## 2018-12-26 NOTE — Progress Notes (Signed)
Doing well - feeling cramping with contractions. Wants CLE as soon as possible. SVE 2/50/-2, AROM for clear fluid No current severe features of PIH Start pitocin 4 hours after last cytotec.    Arty Baumgartner MD

## 2018-12-26 NOTE — Progress Notes (Signed)
7/100/-1 Pit at 8

## 2018-12-26 NOTE — Progress Notes (Signed)
C/c/+1 Push

## 2018-12-26 NOTE — Anesthesia Procedure Notes (Signed)
Epidural Patient location during procedure: OB Start time: 12/26/2018 9:06 AM End time: 12/26/2018 9:11 AM  Staffing Anesthesiologist: Janeece Riggers, MD  Preanesthetic Checklist Completed: patient identified, site marked, surgical consent, pre-op evaluation, timeout performed, IV checked, risks and benefits discussed and monitors and equipment checked  Epidural Patient position: sitting Prep: site prepped and draped and DuraPrep Patient monitoring: continuous pulse ox and blood pressure Approach: midline Location: L3-L4 Injection technique: LOR air  Needle:  Needle type: Tuohy  Needle gauge: 17 G Needle length: 9 cm and 9 Needle insertion depth: 5 cm cm Catheter type: closed end flexible Catheter size: 19 Gauge Catheter at skin depth: 11 cm Test dose: negative  Assessment Events: blood not aspirated, injection not painful, no injection resistance, negative IV test and no paresthesia

## 2018-12-26 NOTE — Progress Notes (Signed)
SVE 4/80/-2 IUPC placed 

## 2018-12-26 NOTE — Progress Notes (Signed)
Doing well IUPC adequate

## 2018-12-26 NOTE — Anesthesia Preprocedure Evaluation (Signed)
Anesthesia Evaluation  Patient identified by MRN, date of birth, ID band Patient awake    Reviewed: Allergy & Precautions, H&P , NPO status , Patient's Chart, lab work & pertinent test results, reviewed documented beta blocker date and time   Airway Mallampati: I  TM Distance: >3 FB Neck ROM: full    Dental no notable dental hx. (+) Teeth Intact, Dental Advisory Given   Pulmonary neg pulmonary ROS,    Pulmonary exam normal breath sounds clear to auscultation       Cardiovascular hypertension, negative cardio ROS Normal cardiovascular exam Rhythm:regular Rate:Normal     Neuro/Psych negative neurological ROS  negative psych ROS   GI/Hepatic negative GI ROS, Neg liver ROS,   Endo/Other  negative endocrine ROS  Renal/GU negative Renal ROS  negative genitourinary   Musculoskeletal   Abdominal   Peds  Hematology  (+) Blood dyscrasia, anemia ,   Anesthesia Other Findings   Reproductive/Obstetrics (+) Pregnancy                             Anesthesia Physical Anesthesia Plan  ASA: III  Anesthesia Plan: Epidural   Post-op Pain Management:    Induction:   PONV Risk Score and Plan:   Airway Management Planned:   Additional Equipment:   Intra-op Plan:   Post-operative Plan:   Informed Consent: I have reviewed the patients History and Physical, chart, labs and discussed the procedure including the risks, benefits and alternatives for the proposed anesthesia with the patient or authorized representative who has indicated his/her understanding and acceptance.     Dental Advisory Given  Plan Discussed with: Anesthesiologist and CRNA  Anesthesia Plan Comments: (Labs checked- platelets confirmed with RN in room. Fetal heart tracing, per RN, reported to be stable enough for sitting procedure. Discussed epidural, and patient consents to the procedure:  included risk of possible  headache,backache, failed block, allergic reaction, and nerve injury. This patient was asked if she had any questions or concerns before the procedure started.)        Anesthesia Quick Evaluation

## 2018-12-27 ENCOUNTER — Encounter (HOSPITAL_COMMUNITY): Payer: Self-pay

## 2018-12-27 LAB — CBC
HCT: 30.1 % — ABNORMAL LOW (ref 36.0–46.0)
HCT: 30.3 % — ABNORMAL LOW (ref 36.0–46.0)
Hemoglobin: 9.6 g/dL — ABNORMAL LOW (ref 12.0–15.0)
Hemoglobin: 9.8 g/dL — ABNORMAL LOW (ref 12.0–15.0)
MCH: 25.1 pg — ABNORMAL LOW (ref 26.0–34.0)
MCH: 25.7 pg — ABNORMAL LOW (ref 26.0–34.0)
MCHC: 31.7 g/dL (ref 30.0–36.0)
MCHC: 32.6 g/dL (ref 30.0–36.0)
MCV: 78.8 fL — ABNORMAL LOW (ref 80.0–100.0)
MCV: 79.1 fL — ABNORMAL LOW (ref 80.0–100.0)
Platelets: 179 10*3/uL (ref 150–400)
Platelets: 203 10*3/uL (ref 150–400)
RBC: 3.82 MIL/uL — ABNORMAL LOW (ref 3.87–5.11)
RBC: 3.83 MIL/uL — ABNORMAL LOW (ref 3.87–5.11)
RDW: 15.9 % — ABNORMAL HIGH (ref 11.5–15.5)
RDW: 16 % — ABNORMAL HIGH (ref 11.5–15.5)
WBC: 12.1 10*3/uL — ABNORMAL HIGH (ref 4.0–10.5)
WBC: 13.4 10*3/uL — ABNORMAL HIGH (ref 4.0–10.5)
nRBC: 0 % (ref 0.0–0.2)
nRBC: 0 % (ref 0.0–0.2)

## 2018-12-27 MED ORDER — COCONUT OIL OIL
1.0000 "application " | TOPICAL_OIL | Status: DC | PRN
  Administered 2018-12-28: 1 via TOPICAL

## 2018-12-27 MED ORDER — ACETAMINOPHEN 325 MG PO TABS: 650.0000 mg | ORAL_TABLET | ORAL | Status: DC | PRN

## 2018-12-27 MED ORDER — ONDANSETRON HCL 4 MG/2ML IJ SOLN: 4.0000 mg | INTRAMUSCULAR | Status: DC | PRN

## 2018-12-27 MED ORDER — IBUPROFEN 100 MG/5ML PO SUSP
600.0000 mg | Freq: Four times a day (QID) | ORAL | Status: DC
  Administered 2018-12-27 – 2018-12-28 (×7): 600 mg via ORAL
  Filled 2018-12-27 (×7): qty 30

## 2018-12-27 MED ORDER — COMPLETENATE 29-1 MG PO CHEW
1.0000 | CHEWABLE_TABLET | Freq: Every day | ORAL | Status: DC
  Administered 2018-12-27 – 2018-12-28 (×2): 1 via ORAL
  Filled 2018-12-27 (×2): qty 1

## 2018-12-27 MED ORDER — PRENATAL MULTIVITAMIN CH: 1.0000 | ORAL_TABLET | Freq: Every day | ORAL | Status: DC

## 2018-12-27 MED ORDER — IBUPROFEN 600 MG PO TABS: 600.0000 mg | ORAL_TABLET | Freq: Four times a day (QID) | ORAL | Status: DC

## 2018-12-27 MED ORDER — DIPHENHYDRAMINE HCL 25 MG PO CAPS: 25.0000 mg | ORAL_CAPSULE | Freq: Four times a day (QID) | ORAL | Status: DC | PRN

## 2018-12-27 MED ORDER — WITCH HAZEL-GLYCERIN EX PADS: 1.0000 "application " | MEDICATED_PAD | CUTANEOUS | Status: DC | PRN

## 2018-12-27 MED ORDER — SENNOSIDES-DOCUSATE SODIUM 8.6-50 MG PO TABS
2.0000 | ORAL_TABLET | ORAL | Status: DC
  Administered 2018-12-27: 2 via ORAL
  Filled 2018-12-27: qty 2

## 2018-12-27 MED ORDER — BENZOCAINE-MENTHOL 20-0.5 % EX AERO
1.0000 "application " | INHALATION_SPRAY | CUTANEOUS | Status: DC | PRN
  Administered 2018-12-27: 1 via TOPICAL
  Filled 2018-12-27: qty 56

## 2018-12-27 MED ORDER — SIMETHICONE 80 MG PO CHEW: 80.0000 mg | CHEWABLE_TABLET | ORAL | Status: DC | PRN

## 2018-12-27 MED ORDER — ACETAMINOPHEN 160 MG/5ML PO SOLN
650.0000 mg | ORAL | Status: DC | PRN
  Administered 2018-12-27 (×3): 650 mg via ORAL
  Filled 2018-12-27 (×3): qty 20.3

## 2018-12-27 MED ORDER — ZOLPIDEM TARTRATE 5 MG PO TABS: 5.0000 mg | ORAL_TABLET | Freq: Every evening | ORAL | Status: DC | PRN

## 2018-12-27 MED ORDER — DIBUCAINE (PERIANAL) 1 % EX OINT: 1.0000 "application " | TOPICAL_OINTMENT | CUTANEOUS | Status: DC | PRN

## 2018-12-27 MED ORDER — TETANUS-DIPHTH-ACELL PERTUSSIS 5-2.5-18.5 LF-MCG/0.5 IM SUSP: 0.5000 mL | Freq: Once | INTRAMUSCULAR | Status: DC

## 2018-12-27 MED ORDER — ONDANSETRON HCL 4 MG PO TABS: 4.0000 mg | ORAL_TABLET | ORAL | Status: DC | PRN

## 2018-12-27 NOTE — Lactation Note (Signed)
This note was copied from a baby's chart. Lactation Consultation Note Baby 7 hrs old. Mom states baby is BF well having no pain or difficulty latching. Mom has everted nipples. Easily expressed colostrum. Mom demonstrated hand expression. LC assisted in proper technique. FOB holding baby. Newborn behavior, STS, I&O, milk storage, breast massage, supply and demand discussed. Mom encouraged to feed baby 8-12 times/24 hours and with feeding cues.  Mom stated she has no questions or concerns. Encouraged to call for assistance if needed. Lactation brochure given.  Patient Name: Suzanne Shaffer [id number] Date: 12/27/2018 Reason for consult: Initial assessment;Primapara;Early term 37-38.6wks   Maternal Data Has patient been taught Hand Expression?: No Does the patient have breastfeeding experience prior to this delivery?: No  Feeding Feeding Type: Breast Fed  LATCH Score       Type of Nipple: Everted at rest and after stimulation  Comfort (Breast/Nipple): Soft / non-tender        Interventions Interventions: Breast feeding basics reviewed;Position options;Breast massage;Hand express;Breast compression  Lactation Tools Discussed/Used WIC Program: No   Consult Status Consult Status: Follow-up Date: 12/28/18 Follow-up type: In-patient    Theodoro Kalata 12/27/2018, 6:44 AM

## 2018-12-27 NOTE — Progress Notes (Signed)
Post Partum Day 1 Subjective: no complaints, up ad lib, voiding and tolerating PO.  No HA, vision change, RUQ pain, CP/SOB.    Objective: Blood pressure 120/85, pulse 69, temperature 98.2 F (36.8 C), temperature source Oral, resp. rate 18, height 5\' 7"  (1.702 m), weight 86.9 kg, SpO2 99 %, unknown if currently breastfeeding.  Physical Exam:  General: alert, cooperative and appears stated age Lochia: appropriate Uterine Fundus: firm Incision: healing well, no significant drainage, no dehiscence DVT Evaluation: No evidence of DVT seen on physical exam. Negative Homan's sign. No cords or calf tenderness.  Recent Labs    12/26/18 2346 12/27/18 0535  HGB 9.8* 9.6*  HCT 30.1* 30.3*    Assessment/Plan: Plan for discharge tomorrow  GHTN-no symptoms.  BP and labs wnl.  Continue to monitor.   LOS: 1 day   Linda Hedges 12/27/2018, 8:54 AM

## 2018-12-27 NOTE — Progress Notes (Signed)
[name] was referred for history of depression/anxiety.  * Referral screened out by Clinical Social Worker because none of the following criteria appear to apply:  ~ History of anxiety/depression during this pregnancy, or of post-partum depression following prior delivery. Per [facility] records, [name] has a history of situational anxiety only. No concerns noted during pregnancy.  ~ Diagnosis of anxiety and/or depression within last 3 years OR * [name]'s symptoms currently being treated with medication and/or therapy.  Please contact the Clinical Social Worker if needs arise, or by [name] request.  Suzanne Shaffer, [place]  [facility] [employer] [phone]

## 2018-12-27 NOTE — Anesthesia Postprocedure Evaluation (Signed)
Anesthesia Post Note  Patient: Suzanne Shaffer  Procedure(s) Performed: AN AD [place]     Patient location during evaluation: Mother Baby Anesthesia Type: Epidural Level of consciousness: awake and alert Pain management: pain level controlled Vital Signs Assessment: post-procedure vital signs reviewed and stable Respiratory status: spontaneous breathing Cardiovascular status: stable Postop Assessment: no headache, adequate PO intake, no backache, patient able to bend at knees, able to ambulate, epidural receding and no apparent nausea or vomiting Anesthetic complications: no    Last Vitals:  Vitals:   12/27/18 0209 12/27/18 0610  BP: 133/77 120/85  Pulse: 74 69  Resp: 20 18  Temp: 36.6 C 36.8 C  SpO2:      Last Pain:  Vitals:   12/27/18 0610  TempSrc: Oral  PainSc: Asleep   Pain Goal:                   [name]

## 2018-12-28 MED ORDER — IBUPROFEN 100 MG/5ML PO SUSP: 600.0000 mg | Freq: Four times a day (QID) | ORAL | 0 refills | Status: DC

## 2018-12-28 NOTE — Discharge Instructions (Signed)
Call MD for T>100.4, heavy vaginal bleeding, severe abdominal pain, or respiratory distress.  Call office to schedule postpartum visit in 6 weeks.  Pelvic rest x 6 weeks.   

## 2018-12-28 NOTE — Discharge Summary (Signed)
Obstetric Discharge Summary Reason for Admission: induction of labor and gestational hypertension Prenatal Procedures: none Intrapartum Procedures: spontaneous vaginal delivery and vacuum Postpartum Procedures: none Complications-Operative and Postpartum: 2nd degree perineal laceration Hemoglobin  Date Value Ref Range Status  12/27/2018 9.6 (L) 12.0 - 15.0 g/dL Final   HCT  Date Value Ref Range Status  12/27/2018 30.3 (L) 36.0 - 46.0 % Final    Physical Exam:  General: alert, cooperative and appears stated age 23: appropriate Uterine Fundus: firm Incision: healing well, no significant drainage, no dehiscence DVT Evaluation: No evidence of DVT seen on physical exam. Negative Homan's sign. No cords or calf tenderness.  Discharge Diagnoses: Term Pregnancy-delivered and gestational hypertension  Discharge Information: Date: 12/28/2018 Activity: pelvic rest Diet: routine Medications: PNV and Ibuprofen Condition: stable Instructions: refer to practice specific booklet Discharge to: home   Newborn Data: Live born female  Birth Weight: 6 lb 7.7 oz (2940 g) APGAR: 7, 8  Newborn Delivery   Birth date/time: 12/26/2018 23:10:00 Delivery type: Vaginal, Vacuum (Extractor)      Home with mother.  Linda Hedges 12/28/2018, 7:17 AM

## 2018-12-28 NOTE — Lactation Note (Signed)
This note was copied from a baby's chart. Lactation Consultation Note  Patient Name: Suzanne Shaffer Today's Date: 12/28/2018  F/U to explain to mom how to use the breast shells between feedings except when sleeping or 10 mins prior to feeding to prevent further soreness. Hand pump and recommended since mom is so tired ( to keep it simple ) after breast massage, hand express, pre-pump to prime the milk ducts to enhance increased let down.  LC encouraged EBM to nipples , and mom was given coconut oil by the RN .   Per mom mentioned she took a nap this afternoon and does feel alittle better.  Baby fussy and MBURN entered the room to assess baby.    Maternal Data    Feeding Feeding Type: Breast Fed  LATCH Score                   Interventions    Lactation Tools Discussed/Used     Consult Status      Jerlyn Ly Rose Hegner 12/28/2018, 3:37 PM

## 2018-12-28 NOTE — Lactation Note (Signed)
This note was copied from a baby's chart. Lactation Consultation Note  Patient Name: Suzanne Shaffer NFAOZ'H Date: 12/28/2018 Reason for consult: Follow-up assessment;Primapara;Infant weight loss;Early term 37-38.6wks;1st time breastfeeding  Baby is 48 hours old  As LC entered the room baby being assessed by the [facility] , mom resting in bed and dad at the bedside.  After her assessment and a wet  Diaper change LC offered to assist to latch  On the right breast / football / and mom was able to latch and LC eased down on the chin to increase depth and flip upper lip. Swallows increased with breast compressions. Baby fed for 20 mins and baby was noted to be non - nutritive and LC instructed mom in how to release baby. Nipple was slightly pinched on both sides of the nipple.  LC stepped out for short moment for supplies and when LC came back  Mom was noted to be teary and was heading to the bathroom.  LC will F/U to instruct mom on the use of hand pump and shells ( due to mom mentioning she is sore ).  Mom expressed she was feeling tired today.    Maternal Data Has patient been taught Hand Expression?: Yes  Feeding Feeding Type: Breast Fed  LATCH Score Latch: Grasps breast easily, tongue down, lips flanged, rhythmical sucking.  Audible Swallowing: Spontaneous and intermittent  Type of Nipple: Everted at rest and after stimulation  Comfort (Breast/Nipple): Filling, red/small blisters or bruises, mild/mod discomfort  Hold (Positioning): Assistance needed to correctly position infant at breast and maintain latch.  LATCH Score: 8  Interventions Interventions: Breast feeding basics reviewed;Assisted with latch;Skin to skin;Breast massage;Hand express;Breast compression;Adjust position;Support pillows;Position options  Lactation Tools Discussed/Used Tools: Pump;Shells Shell Type: Inverted Breast pump type: Manual Pump Review: Milk Storage;Setup, frequency, and cleaning Initiated by::  MAI Date initiated:: 12/28/18   Consult Status Consult Status: Follow-up Date: 12/29/18 Follow-up type: In-patient    [place] 12/28/2018, 11:28 AM

## 2018-12-29 ENCOUNTER — Ambulatory Visit: Payer: Self-pay

## 2018-12-29 NOTE — Lactation Note (Signed)
This note was copied from a baby's chart. Lactation Consultation Note  Patient Name: Suzanne Shaffer RUEAV'W Date: 12/29/2018 Reason for consult: Follow-up assessment;Primapara;1st time breastfeeding;Early term 37-38.6wks;Infant weight loss;Other (Comment)(8 % weight loss) Baby is 60 hours old and for D/C .  Baby in care seat and mom waiting on dad.  Per mom the baby recently fed at 9:45 for 30 mins . Breast are feeling Heavier today and more swallows. Nipples are alittle sore.  LC reminded mom to wear the shells between feedings except when sleeping Until soreness improves. Has a hand pump.  Mom aware of the [facility] resources.    Maternal Data    Feeding Feeding Type: (per mom last feeding at 9:45 am)  LATCH Score Latch: Grasps breast easily, tongue down, lips flanged, rhythmical sucking.  Audible Swallowing: Spontaneous and intermittent  Type of Nipple: Everted at rest and after stimulation  Comfort (Breast/Nipple): Filling, red/small blisters or bruises, mild/mod discomfort  Hold (Positioning): Assistance needed to correctly position infant at breast and maintain latch.  LATCH Score: 8  Interventions Interventions: Breast feeding basics reviewed;Shells;Hand pump  Lactation Tools Discussed/Used Pump Review: Setup, frequency, and cleaning;Milk Storage   Consult Status Consult Status: Complete Date: 12/29/18    Myer Haff 12/29/2018, 11:36 AM

## 2021-03-05 IMAGING — US US MFM OB TRANSVAGINAL
1 series · 15 of 24 positions shown · non-contrast
Comparison: none

[Series 1: us mfm ob transvaginal · 15 of 24 slices shown]
[im 1/24]
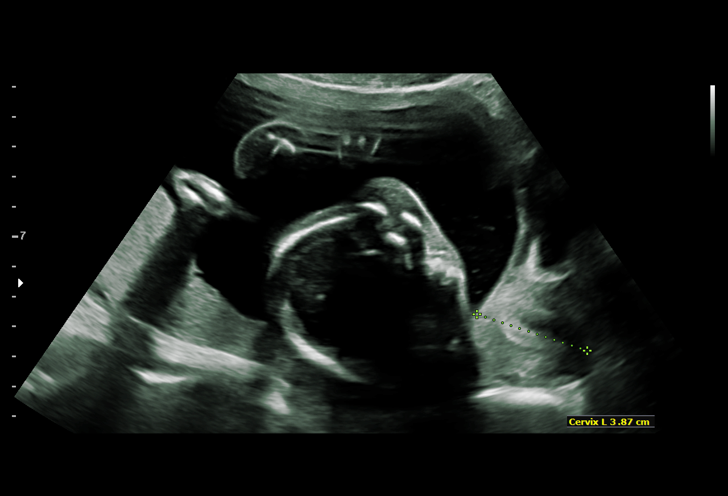
[im 3/24]
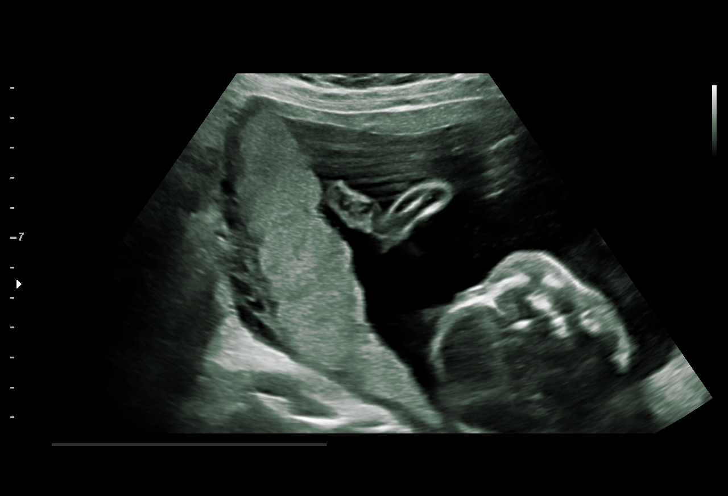
[im 5/24]
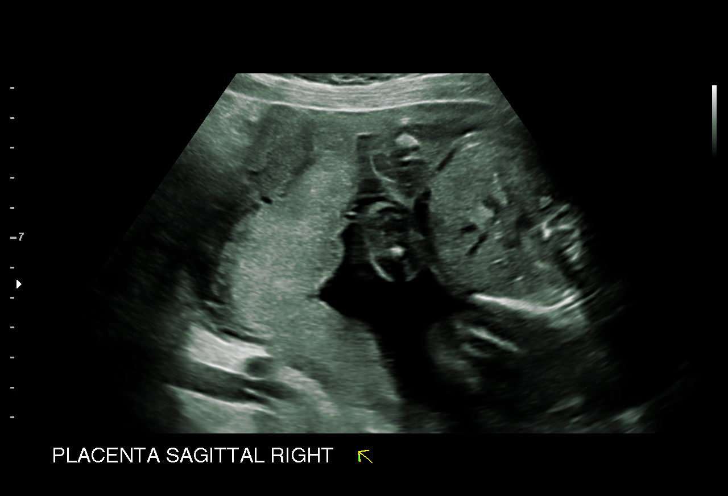
[im 6/24]
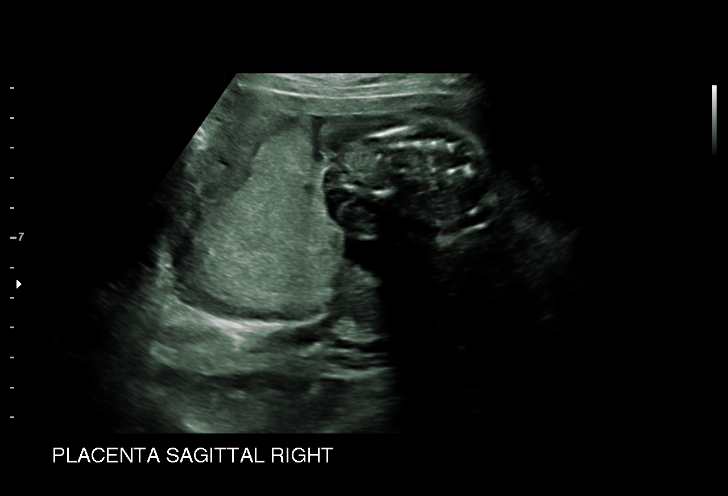
[im 8/24]
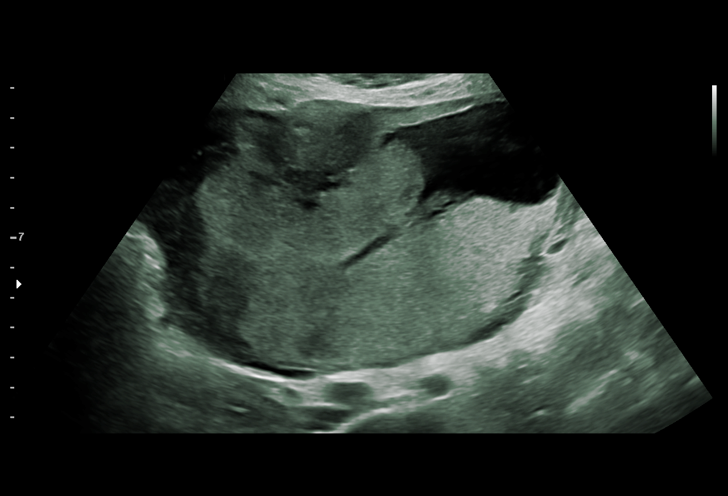
[im 9/24]
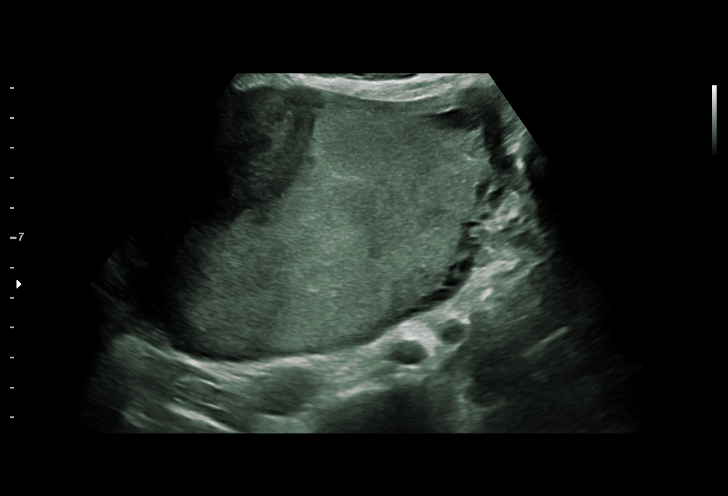
[im 11/24]
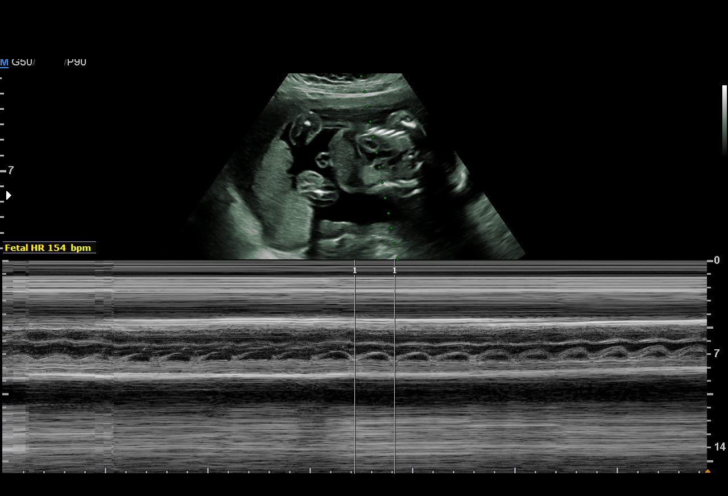
[im 13/24]
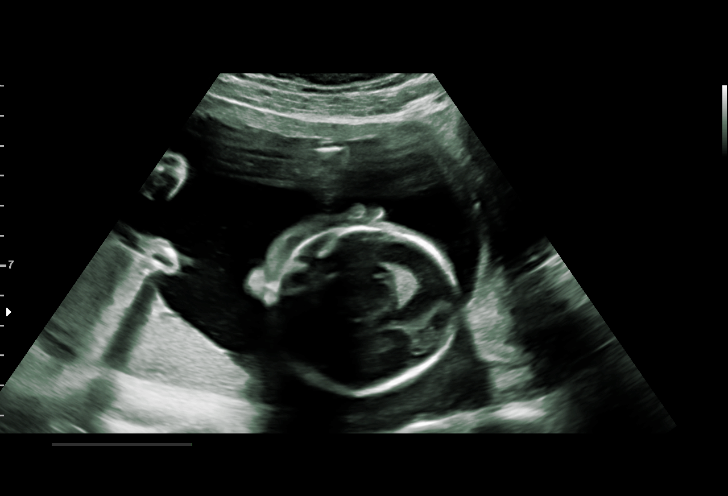
[im 14/24]
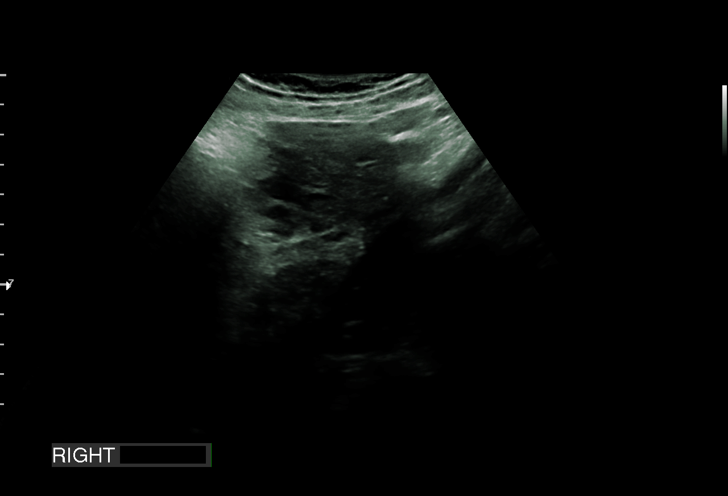
[im 16/24]
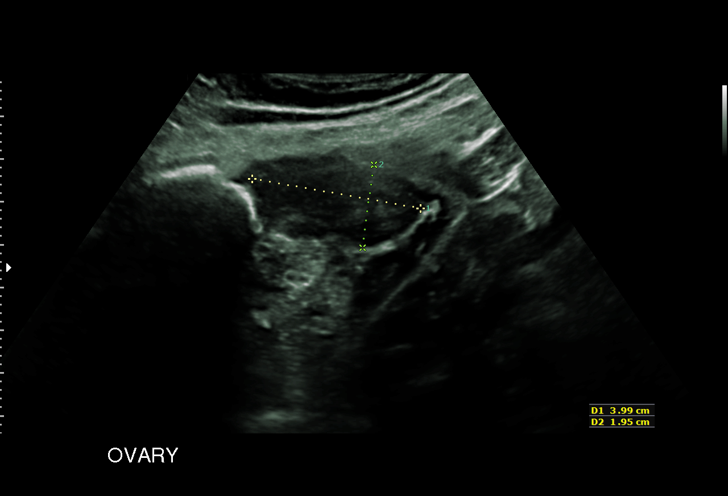
[im 17/24]
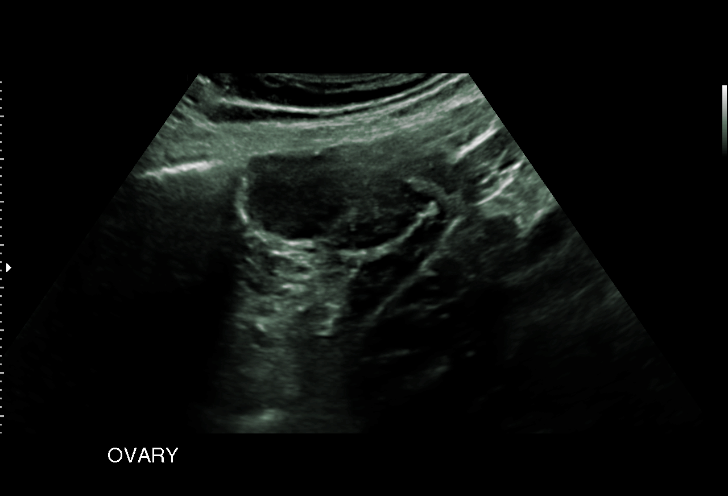
[im 19/24]
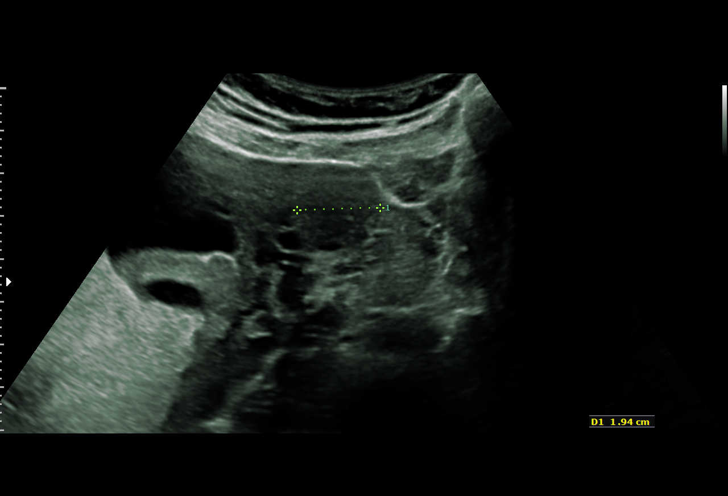
[im 21/24]
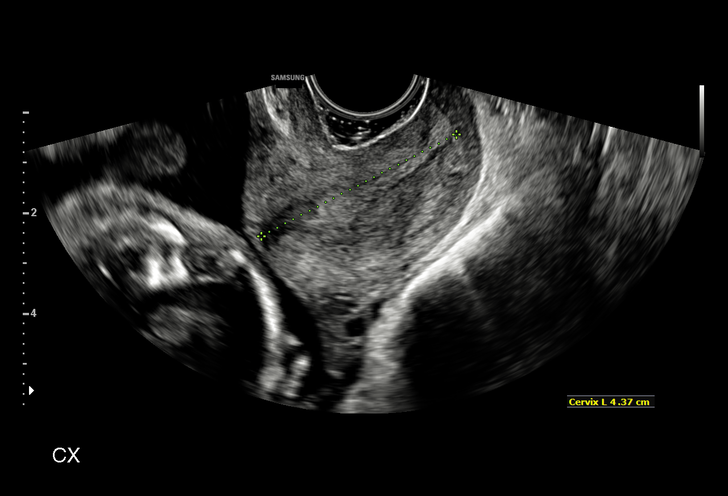
[im 22/24]
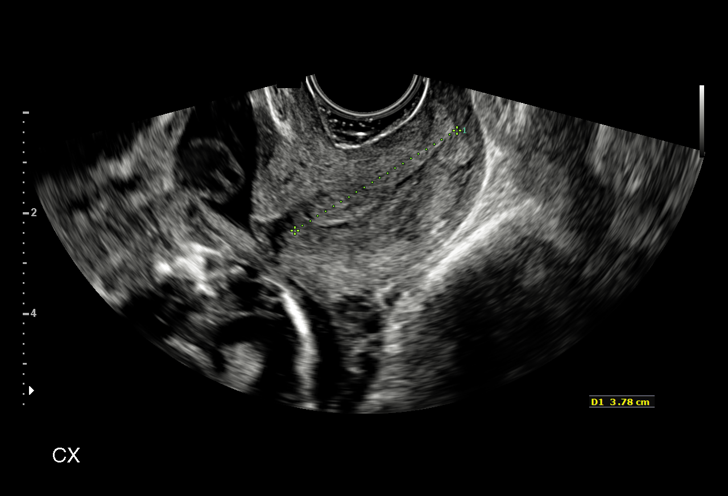
[im 24/24]
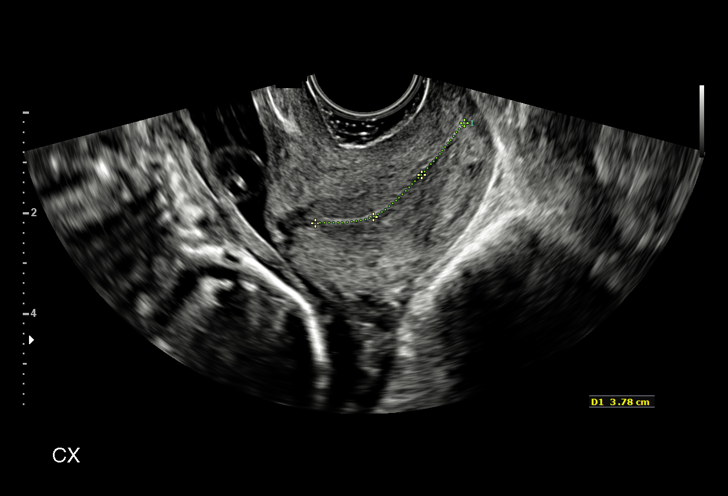

[15 of 24 positions shown; findings below may reference images not displayed]

1  US MFM OB LIMITED                    76815.01     NAJIMA STYLES
 ----------------------------------------------------------------------

 ----------------------------------------------------------------------
Indications

  Abdominal pain in pregnancy
  Severe back pain
  21 weeks gestation of pregnancy
  Vaginal discharge during pregnancy in
  second trimester
 ----------------------------------------------------------------------
Fetal Evaluation

 Num Of Fetuses:         1
 Fetal Heart Rate(bpm):  154
 Cardiac Activity:       Observed
 Presentation:           Cephalic
 Placenta:               Posterior Fundal

 Amniotic Fluid
 AFI FV:      Within normal limits

                             Largest Pocket(cm)

OB History

 Gravidity:    1
Gestational Age

 Clinical EDD:  21w 4d                                        EDD:   01/12/19
 Best:          21w 4d     Det. By:  Clinical EDD             EDD:   01/12/19
Cervix Uterus Adnexa
 Cervix
 Length:            3.8  cm.
 Normal appearance by transvaginal scan

 Left Ovary
 Within normal limits.

 Right Ovary
 Not visualized.
Impression

 Patient was evaluated for c/o back pain and increased
 vaginal discharge.
 A limited ultrasound study was performed. Amniotic fluid is
 normal and good fetal activity is seen. Placenta appears
 normal.
 On transvaginal scan, the cervix measures 3.8 cm, which is
 normal.
                 Ofarrell, Huma

## 2021-11-25 LAB — OB RESULTS CONSOLE ABO/RH: RH Type: POSITIVE

## 2021-11-25 LAB — OB RESULTS CONSOLE ANTIBODY SCREEN: Antibody Screen: NEGATIVE

## 2021-11-25 LAB — OB RESULTS CONSOLE RUBELLA ANTIBODY, IGM: Rubella: IMMUNE

## 2021-11-25 LAB — OB RESULTS CONSOLE HIV ANTIBODY (ROUTINE TESTING): HIV: NONREACTIVE

## 2021-11-25 LAB — OB RESULTS CONSOLE HEPATITIS B SURFACE ANTIGEN: Hepatitis B Surface Ag: NEGATIVE

## 2021-11-25 LAB — OB RESULTS CONSOLE RPR: RPR: NONREACTIVE

## 2021-11-25 LAB — HEPATITIS C ANTIBODY: HCV Ab: NEGATIVE

## 2021-12-09 LAB — OB RESULTS CONSOLE GC/CHLAMYDIA
Chlamydia: NEGATIVE
Neisseria Gonorrhea: NEGATIVE

## 2022-01-26 ENCOUNTER — Other Ambulatory Visit: Payer: Self-pay

## 2022-01-26 DIAGNOSIS — O99012 Anemia complicating pregnancy, second trimester: Secondary | ICD-10-CM

## 2022-01-31 ENCOUNTER — Encounter: Payer: Self-pay | Admitting: Obstetrics and Gynecology

## 2022-01-31 ENCOUNTER — Telehealth: Payer: Self-pay | Admitting: Pharmacy Technician

## 2022-01-31 NOTE — Telephone Encounter (Signed)
Dr. Elon Spanner, [place] Childes note:  Auth Submission: NO AUTH NEEDED Payer: BCBS Medication & CPT/J Code(s) submitted: Venofer (Iron Sucrose) [id number] Route of submission (phone, fax, portal):  Phone # Fax # Auth type: Buy/Bill Units/visits requested: 3 DOSES Reference number:  Approval from: 01/31/22 to 06/02/22   Patient will be scheduled as soon as possible

## 2022-02-07 ENCOUNTER — Encounter: Payer: Self-pay | Admitting: Obstetrics and Gynecology

## 2022-02-07 ENCOUNTER — Ambulatory Visit (INDEPENDENT_AMBULATORY_CARE_PROVIDER_SITE_OTHER): Payer: Commercial Managed Care - PPO

## 2022-02-07 VITALS — BP 113/69 | HR 94 | Temp 98.0°F | Resp 18 | Ht 67.0 in | Wt 176.2 lb

## 2022-02-07 DIAGNOSIS — D508 Other iron deficiency anemias: Secondary | ICD-10-CM

## 2022-02-07 DIAGNOSIS — O99012 Anemia complicating pregnancy, second trimester: Secondary | ICD-10-CM

## 2022-02-07 DIAGNOSIS — Z3A00 Weeks of gestation of pregnancy not specified: Secondary | ICD-10-CM

## 2022-02-07 MED ORDER — DIPHENHYDRAMINE HCL 25 MG PO CAPS
25.0000 mg | ORAL_CAPSULE | Freq: Once | ORAL | Status: DC
  Filled 2022-02-07: qty 1

## 2022-02-07 MED ORDER — ACETAMINOPHEN 325 MG PO TABS
650.0000 mg | ORAL_TABLET | Freq: Once | ORAL | Status: DC
  Filled 2022-02-07: qty 2

## 2022-02-07 MED ORDER — DIPHENHYDRAMINE HCL 12.5 MG/5ML PO ELIX
25.0000 mg | ORAL_SOLUTION | Freq: Once | ORAL | Status: AC
  Administered 2022-02-07: 25 mg via ORAL
  Filled 2022-02-07: qty 10

## 2022-02-07 MED ORDER — ACETAMINOPHEN 160 MG/5ML PO SOLN
650.0000 mg | Freq: Once | ORAL | Status: AC
  Administered 2022-02-07: 650 mg via ORAL
  Filled 2022-02-07: qty 20.3

## 2022-02-07 MED ORDER — SODIUM CHLORIDE 0.9 % IV SOLN
300.0000 mg | Freq: Once | INTRAVENOUS | Status: AC
  Administered 2022-02-07: 300 mg via INTRAVENOUS
  Filled 2022-02-07: qty 15

## 2022-02-07 NOTE — Progress Notes (Addendum)
Diagnosis: Iron Deficiency Anemia  Provider:  Marshell Garfinkel MD  Procedure: Infusion  IV Type: Peripheral, IV Location: L Antecubital  Venofer (Iron Sucrose), Dose: 300 mg  Infusion Start Time: 7124  Infusion Stop Time: 5809  Post Infusion IV Care: Observation period completed and Peripheral IV Discontinued  Discharge: Condition: Good, Destination: Home . AVS provided to patient.   Performed by:  Cleophus Molt, RN

## 2022-02-07 NOTE — Patient Instructions (Signed)

## 2022-02-14 ENCOUNTER — Ambulatory Visit: Payer: Commercial Managed Care - PPO

## 2022-02-14 VITALS — BP 120/72 | HR 91 | Temp 98.3°F | Resp 18 | Ht 67.0 in | Wt 178.4 lb

## 2022-02-14 DIAGNOSIS — Z3A00 Weeks of gestation of pregnancy not specified: Secondary | ICD-10-CM

## 2022-02-14 DIAGNOSIS — D508 Other iron deficiency anemias: Secondary | ICD-10-CM

## 2022-02-14 DIAGNOSIS — O99012 Anemia complicating pregnancy, second trimester: Secondary | ICD-10-CM | POA: Diagnosis not present

## 2022-02-14 MED ORDER — ACETAMINOPHEN 160 MG/5ML PO SOLN
650.0000 mg | Freq: Once | ORAL | Status: AC
  Administered 2022-02-14: 650 mg via ORAL
  Filled 2022-02-14: qty 20.3

## 2022-02-14 MED ORDER — ACETAMINOPHEN 325 MG PO TABS: 650.0000 mg | ORAL_TABLET | Freq: Once | ORAL | Status: DC

## 2022-02-14 MED ORDER — DIPHENHYDRAMINE HCL 25 MG PO CAPS: 25.0000 mg | ORAL_CAPSULE | Freq: Once | ORAL | Status: DC

## 2022-02-14 MED ORDER — DIPHENHYDRAMINE HCL 12.5 MG/5ML PO ELIX
25.0000 mg | ORAL_SOLUTION | Freq: Once | ORAL | Status: AC
  Administered 2022-02-14: 25 mg via ORAL
  Filled 2022-02-14: qty 10

## 2022-02-14 MED ORDER — SODIUM CHLORIDE 0.9 % IV SOLN
300.0000 mg | Freq: Once | INTRAVENOUS | Status: AC
  Administered 2022-02-14: 300 mg via INTRAVENOUS
  Filled 2022-02-14: qty 15

## 2022-02-14 NOTE — Progress Notes (Signed)
Diagnosis: Iron Deficiency Anemia  Provider:  Marshell Garfinkel MD  Procedure: Infusion  IV Type: Peripheral, IV Location: L Antecubital  Venofer (Iron Sucrose), Dose: 300 mg  Infusion Start Time: 6283  Infusion Stop Time: 6629  Post Infusion IV Care: Peripheral IV Discontinued  Discharge: Condition: Good, Destination: Home . AVS provided to patient.   Performed by:  Arnoldo Morale, RN

## 2022-02-28 ENCOUNTER — Ambulatory Visit: Payer: Commercial Managed Care - PPO

## 2022-02-28 VITALS — BP 117/74 | HR 78 | Temp 97.7°F | Resp 18 | Ht 67.0 in | Wt 183.0 lb

## 2022-02-28 DIAGNOSIS — O99012 Anemia complicating pregnancy, second trimester: Secondary | ICD-10-CM | POA: Diagnosis not present

## 2022-02-28 DIAGNOSIS — D508 Other iron deficiency anemias: Secondary | ICD-10-CM | POA: Diagnosis not present

## 2022-02-28 DIAGNOSIS — Z3A00 Weeks of gestation of pregnancy not specified: Secondary | ICD-10-CM | POA: Diagnosis not present

## 2022-02-28 MED ORDER — SODIUM CHLORIDE 0.9 % IV SOLN
300.0000 mg | Freq: Once | INTRAVENOUS | Status: AC
  Administered 2022-02-28: 300 mg via INTRAVENOUS
  Filled 2022-02-28: qty 15

## 2022-02-28 MED ORDER — ACETAMINOPHEN 160 MG/5ML PO SOLN
650.0000 mg | Freq: Once | ORAL | Status: AC
  Administered 2022-02-28: 650 mg via ORAL

## 2022-02-28 MED ORDER — DIPHENHYDRAMINE HCL 12.5 MG/5ML PO ELIX
25.0000 mg | ORAL_SOLUTION | Freq: Once | ORAL | Status: AC
  Administered 2022-02-28: 25 mg via ORAL

## 2022-02-28 MED ORDER — DIPHENHYDRAMINE HCL 25 MG PO CAPS: 25.0000 mg | ORAL_CAPSULE | Freq: Once | ORAL | Status: DC

## 2022-02-28 MED ORDER — ACETAMINOPHEN 325 MG PO TABS: 650.0000 mg | ORAL_TABLET | Freq: Once | ORAL | Status: DC

## 2022-02-28 NOTE — Progress Notes (Signed)
Diagnosis: Iron Deficiency Anemia  Provider:  Marshell Garfinkel MD  Procedure: Infusion  IV Type: Peripheral, IV Location: L Antecubital  Venofer (Iron Sucrose), Dose: 300 mg  Infusion Start Time: 1004  Infusion Stop Time: 8270  Post Infusion IV Care: Peripheral IV Discontinued  Discharge: Condition: Good, Destination: Home . AVS provided to patient.   Performed by:  Arnoldo Morale, RN

## 2022-03-03 ENCOUNTER — Encounter: Payer: Self-pay | Admitting: Cardiology

## 2022-03-03 ENCOUNTER — Other Ambulatory Visit (INDEPENDENT_AMBULATORY_CARE_PROVIDER_SITE_OTHER): Payer: Commercial Managed Care - PPO

## 2022-03-03 ENCOUNTER — Ambulatory Visit (INDEPENDENT_AMBULATORY_CARE_PROVIDER_SITE_OTHER): Payer: Commercial Managed Care - PPO | Admitting: Cardiology

## 2022-03-03 VITALS — BP 132/82 | HR 97 | Ht 67.0 in | Wt 184.0 lb

## 2022-03-03 DIAGNOSIS — R000 Tachycardia, unspecified: Secondary | ICD-10-CM | POA: Diagnosis not present

## 2022-03-03 MED ORDER — LABETALOL HCL 200 MG PO TABS: 200.0000 mg | ORAL_TABLET | Freq: Three times a day (TID) | ORAL | 3 refills | Status: DC

## 2022-03-03 NOTE — Progress Notes (Unsigned)
Cardio-Obstetrics Clinic  New Evaluation  Date:  03/04/2022   ID:  Suzanne Shaffer, DOB 1995/03/14, MRN [id number]  PCP:  Patient, No Pcp Per   [place] Providers Cardiologist:  None  Electrophysiologist:  None       Referring MD: Suzanne Hedges, DO   Chief Complaint: " I am having stress   History of Present Illness:    Suzanne Shaffer is a 27 y.o. female [[id number]] who is being seen today for the evaluation of palpitations at the request of Morris, Megan, DO.   Medical history includes anemia, chronic kidney disease, pregnancy-induced hypertension.  She is here today to be evaluated for palpitations and hypertension in pregnancy.  She tells me that both of her parents have heart disease mom with recent CABG and dad with atrial fibrillation he is status post ablation. She has had these palpitation prior to pregnancy but these are getting worse.  Prior CV Studies Reviewed: The following studies were reviewed today: None today   Past Medical History:  Diagnosis Date   Anemia    Anxiety    Chronic kidney disease    Frequent UTI's   Pregnancy induced hypertension     Past Surgical History:  Procedure Laterality Date   WISDOM TOOTH EXTRACTION Bilateral    Upper only      OB History     Gravida  1   Para  1   Term  1   Preterm      AB      Living  1      SAB      IAB      Ectopic      Multiple  0   Live Births  1               Current Medications: Current Meds  Medication Sig   Prenatal Vit-Fe Fumarate-FA (MULTIVITAMIN-PRENATAL) 27-0.8 MG TABS tablet Take 1 tablet by mouth daily at 12 noon.   [DISCONTINUED] labetalol (NORMODYNE) 200 MG tablet Take 1 tablet twice a day by oral route.     Allergies:   Sulfa antibiotics   Social History   Socioeconomic History   Marital status: Single    Spouse name: Not on file   Number of children: Not on file   Years of education: Not on file   Highest education level: Not on file   Occupational History   Not on file  Tobacco Use   Smoking status: Never   Smokeless tobacco: Never  Vaping Use   Vaping Use: Never used  Substance and Sexual Activity   Alcohol use: Not Currently    Comment: Occasional   Drug use: Never   Sexual activity: Yes    Birth control/protection: None  Other Topics Concern   Not on file  Social History Narrative   Not on file   Social Determinants of Health   Financial Resource Strain: Not on file  Food Insecurity: Not on file  Transportation Needs: Not on file  Physical Activity: Not on file  Stress: Not on file  Social Connections: Not on file      Family History  Problem Relation Age of Onset   Healthy Mother    Healthy Father       ROS:   Please see the history of present illness.    Palpitations  All other systems reviewed and are negative.   Labs/EKG Reviewed:    EKG:   EKG is was ordered today.    Recent  Labs: No results found for requested labs within last 365 days.   Recent Lipid Panel No results found for: "CHOL", "TRIG", "HDL", "CHOLHDL", "LDLCALC", "LDLDIRECT"  Physical Exam:    VS:  BP 132/82   Pulse 97   Ht 5\' 7"  (1.702 m)   Wt 83.5 kg   SpO2 99%   BMI 28.82 kg/m     Wt Readings from Last 3 Encounters:  03/03/22 83.5 kg  02/28/22 83 kg  02/14/22 80.9 kg     GEN:  Well nourished, well developed in no acute distress HEENT: Normal NECK: No JVD; No carotid bruits LYMPHATICS: No lymphadenopathy CARDIAC: RRR, no murmurs, rubs, gallops RESPIRATORY:  Clear to auscultation without rales, wheezing or rhonchi  ABDOMEN: Soft, non-tender, non-distended MUSCULOSKELETAL:  No edema; No deformity  SKIN: Warm and dry NEUROLOGIC:  Alert and oriented x 3 PSYCHIATRIC:  Normal affect    Risk Assessment/Risk Calculators:     CARPREG II Risk Prediction Index Score:  1.  The patient's risk for a primary cardiac event is 5%.            ASSESSMENT & PLAN:    Palpitations   I would like to  rule out a cardiovascular etiology of this palpitation, therefore at this time I would like to placed a zio patch for 14  days.   I will increase labetalol 200 mg 3 times daily.    Patient Instructions  Medication Instructions:  Your physician has recommended you make the following change in your medication:  INCREASE: Labetalol to three times daily  *If you need a refill on your cardiac medications before your next appointment, please call your pharmacy*   Lab Work: None   Testing/Procedures: ZIO AT Long term monitor-Live Telemetry  Your physician has requested you wear a ZIO patch monitor for 14 days.  This is a single patch monitor. [employer] supplies one patch monitor per enrollment. Additional  stickers are not available.  Please do not apply patch if you will be having a Nuclear Stress Test, Echocardiogram, Cardiac CT, MRI,  or Chest Xray during the period you would be wearing the monitor. The patch cannot be worn during  these tests. You cannot remove and re-apply the ZIO AT patch monitor.  Your ZIO patch monitor will be mailed 3 day USPS to your address on file. It may take 3-5 days to  receive your monitor after you have been enrolled.  Once you have received your monitor, please review the enclosed instructions. Your monitor has  already been registered assigning a specific monitor serial # to you.   Billing and Patient Assistance Program information  [name] has been supplied with any insurance information on record for billing. [employer] offers a sliding scale Patient Assistance Program for patients without insurance, or whose  insurance does not completely cover the cost of the ZIO patch monitor. You must apply for the  Patient Assistance Program to qualify for the discounted rate. To apply, call [employer] at [phone],  select option 4, select option 2 , ask to apply for the Patient Assistance Program, (you can request an  interpreter if needed). [employer] will ask your  household income and how many people are in your  household. [employer] will quote your out-of-pocket cost based on this information. They will also be able  to set up a 12 month interest free payment plan if needed.  Applying the monitor   Shave hair from upper left chest.  Hold the abrader disc by orange tab.  Rub the abrader in 40 strokes over left upper chest as indicated in  your monitor instructions.  Clean area with 4 enclosed alcohol pads. Use all pads to ensure the area is cleaned thoroughly. Let  dry.  Apply patch as indicated in monitor instructions. Patch will be placed under collarbone on left side of  chest with arrow pointing upward.  Rub patch adhesive wings for 2 minutes. Remove the white label marked "1". Remove the white label  marked "2". Rub patch adhesive wings for 2 additional minutes.  While looking in a mirror, press and release button in center of patch. A small green light will flash 3-4  times. This will be your only indicator that the monitor has been turned on.  Do not shower for the first 24 hours. You may shower after the first 24 hours.  Press the button if you feel a symptom. You will hear a small click. Record Date, Time and Symptom in  the Patient Log.   Starting the Gateway  In your kit there is a Audiological scientist box the size of a cellphone. This is Buyer, retail. It transmits all your  recorded data to [facility]. This box must always stay within 10 feet of you. Open the box and push the *  button. There will be a light that blinks orange and then green a few times. When the light stops  blinking, the Gateway is connected to the ZIO patch. Call [employer] at [phone] to confirm your monitor is transmitting.  Returning your monitor  Remove your patch and place it inside the Gateway. In the lower half of the Gateway there is a white  bag with prepaid postage on it. Place Gateway in bag and seal. Mail package back to [place] as soon as  possible. Your  physician should have your final report approximately 7 days after you have mailed back  your monitor. Call [facility] Customer Care at [phone] if you have questions regarding your ZIO AT  patch monitor. Call them immediately if you see an orange light blinking on your monitor.  If your monitor falls off in less than 4 days, contact our Monitor department at [phone]. If your  monitor becomes loose or falls off after 4 days call [employer] at [phone] for suggestions on  securing your monitor    Follow-Up: At [facility], you and your health needs are our priority.  As part of our continuing mission to provide you with exceptional heart care, we have created designated Provider Care Teams.  These Care Teams include your primary Cardiologist (physician) and Advanced Practice Providers (APPs -  Physician Assistants and Nurse Practitioners) who all work together to provide you with the care you need, when you need it.   Your next appointment:   8-10 week(s)  Provider:   Thomasene Ripple  [facility] [address] [place], [place] [address]      Dispo:  No follow-ups on file.   Medication Adjustments/Labs and Tests Ordered: Current medicines are reviewed at length with the patient today.  Concerns regarding medicines are outlined above.  Tests Ordered: Orders Placed This Encounter  Procedures   LONG TERM MONITOR-LIVE TELEMETRY (3-14 DAYS)   Medication Changes: Meds ordered this encounter  Medications   labetalol (NORMODYNE) 200 MG tablet    Sig: Take 1 tablet (200 mg total) by mouth 3 (three) times daily.    Dispense:  270 tablet    Refill:  3

## 2022-03-03 NOTE — Patient Instructions (Addendum)
Medication Instructions:  Your physician has recommended you make the following change in your medication:  INCREASE: Labetalol to three times daily  *If you need a refill on your cardiac medications before your next appointment, please call your pharmacy*   Lab Work: None   Testing/Procedures: ZIO AT Long term monitor-Live Telemetry  Your physician has requested you wear a ZIO patch monitor for 14 days.  This is a single patch monitor. [employer] supplies one patch monitor per enrollment. Additional  stickers are not available.  Please do not apply patch if you will be having a Nuclear Stress Test, Echocardiogram, Cardiac CT, MRI,  or Chest Xray during the period you would be wearing the monitor. The patch cannot be worn during  these tests. You cannot remove and re-apply the ZIO AT patch monitor.  Your ZIO patch monitor will be mailed 3 day USPS to your address on file. It may take 3-5 days to  receive your monitor after you have been enrolled.  Once you have received your monitor, please review the enclosed instructions. Your monitor has  already been registered assigning a specific monitor serial # to you.   Billing and Patient Assistance Program information  [name] has been supplied with any insurance information on record for billing. [employer] offers a sliding scale Patient Assistance Program for patients without insurance, or whose  insurance does not completely cover the cost of the ZIO patch monitor. You must apply for the  Patient Assistance Program to qualify for the discounted rate. To apply, call [employer] at [phone],  select option 4, select option 2 , ask to apply for the Patient Assistance Program, (you can request an  interpreter if needed). [employer] will ask your household income and how many people are in your  household. [employer] will quote your out-of-pocket cost based on this information. They will also be able  to set up a 12 month interest free payment plan if  needed.  Applying the monitor   Shave hair from upper left chest.  Hold the abrader disc by orange tab. Rub the abrader in 40 strokes over left upper chest as indicated in  your monitor instructions.  Clean area with 4 enclosed alcohol pads. Use all pads to ensure the area is cleaned thoroughly. Let  dry.  Apply patch as indicated in monitor instructions. Patch will be placed under collarbone on left side of  chest with arrow pointing upward.  Rub patch adhesive wings for 2 minutes. Remove the white label marked "1". Remove the white label  marked "2". Rub patch adhesive wings for 2 additional minutes.  While looking in a mirror, press and release button in center of patch. A small green light will flash 3-4  times. This will be your only indicator that the monitor has been turned on.  Do not shower for the first 24 hours. You may shower after the first 24 hours.  Press the button if you feel a symptom. You will hear a small click. Record Date, Time and Symptom in  the Patient Log.   Starting the Gateway  In your kit there is a Hydrographic surveyor box the size of a cellphone. This is Airline pilot. It transmits all your  recorded data to [facility]. This box must always stay within 10 feet of you. Open the box and push the *  button. There will be a light that blinks orange and then green a few times. When the light stops  blinking, the Gateway is connected to  the ZIO patch. Call [employer] at [phone] to confirm your monitor is transmitting.  Returning your monitor  Remove your patch and place it inside the [place]. In the lower half of the Gateway there is a white  bag with prepaid postage on it. Place Gateway in bag and seal. Mail package back to [place] as soon as  possible. Your physician should have your final report approximately 7 days after you have mailed back  your monitor. Call [place] at [phone] if you have questions regarding your ZIO AT  patch  monitor. Call them immediately if you see an orange light blinking on your monitor.  If your monitor falls off in less than 4 days, contact our Monitor department at [phone]. If your  monitor becomes loose or falls off after 4 days call [employer] at [phone] for suggestions on  securing your monitor    Follow-Up: At [facility], you and your health needs are our priority.  As part of our continuing mission to provide you with exceptional heart care, we have created designated Provider Care Teams.  These Care Teams include your primary Cardiologist (physician) and Advanced Practice Providers (APPs -  Physician Assistants and Nurse Practitioners) who all work together to provide you with the care you need, when you need it.   Your next appointment:   8-10 week(s)  Provider:   Berniece Salines  [facility] [address], [place], [place] [address]

## 2022-03-27 ENCOUNTER — Encounter: Payer: Self-pay | Admitting: Obstetrics and Gynecology

## 2022-03-27 ENCOUNTER — Inpatient Hospital Stay (HOSPITAL_COMMUNITY)
Admission: AD | Admit: 2022-03-27 | Discharge: 2022-03-27 | Disposition: A | Discharge status: Home / Self Care | Payer: Commercial Managed Care - PPO | Attending: Obstetrics and Gynecology | Admitting: Obstetrics and Gynecology

## 2022-03-27 ENCOUNTER — Encounter (HOSPITAL_COMMUNITY): Payer: Self-pay | Admitting: Obstetrics and Gynecology

## 2022-03-27 DIAGNOSIS — O10212 Pre-existing hypertensive chronic kidney disease complicating pregnancy, second trimester: Secondary | ICD-10-CM | POA: Diagnosis not present

## 2022-03-27 DIAGNOSIS — Z3A26 26 weeks gestation of pregnancy: Secondary | ICD-10-CM | POA: Diagnosis not present

## 2022-03-27 DIAGNOSIS — N189 Chronic kidney disease, unspecified: Secondary | ICD-10-CM | POA: Diagnosis not present

## 2022-03-27 DIAGNOSIS — I129 Hypertensive chronic kidney disease with stage 1 through stage 4 chronic kidney disease, or unspecified chronic kidney disease: Secondary | ICD-10-CM | POA: Insufficient documentation

## 2022-03-27 DIAGNOSIS — Z79899 Other long term (current) drug therapy: Secondary | ICD-10-CM | POA: Diagnosis not present

## 2022-03-27 DIAGNOSIS — O10919 Unspecified pre-existing hypertension complicating pregnancy, unspecified trimester: Secondary | ICD-10-CM

## 2022-03-27 DIAGNOSIS — O10912 Unspecified pre-existing hypertension complicating pregnancy, second trimester: Secondary | ICD-10-CM | POA: Diagnosis not present

## 2022-03-27 LAB — CBC
HCT: 34 % — ABNORMAL LOW (ref 36.0–46.0)
Hemoglobin: 11.2 g/dL — ABNORMAL LOW (ref 12.0–15.0)
MCH: 26.5 pg (ref 26.0–34.0)
MCHC: 32.9 g/dL (ref 30.0–36.0)
MCV: 80.4 fL (ref 80.0–100.0)
Platelets: 260 10*3/uL (ref 150–400)
RBC: 4.23 MIL/uL (ref 3.87–5.11)
RDW: 21.2 % — ABNORMAL HIGH (ref 11.5–15.5)
WBC: 6.6 10*3/uL (ref 4.0–10.5)
nRBC: 0 % (ref 0.0–0.2)

## 2022-03-27 LAB — COMPREHENSIVE METABOLIC PANEL
ALT: 18 U/L (ref 0–44)
AST: 18 U/L (ref 15–41)
Albumin: 2.9 g/dL — ABNORMAL LOW (ref 3.5–5.0)
Alkaline Phosphatase: 61 U/L (ref 38–126)
Anion gap: 8 (ref 5–15)
BUN: <5 mg/dL — ABNORMAL LOW (ref 6–20)
CO2: 21 mmol/L — ABNORMAL LOW (ref 22–32)
Calcium: 8.3 mg/dL — ABNORMAL LOW (ref 8.9–10.3)
Chloride: 107 mmol/L (ref 98–111)
Creatinine, Ser: 0.55 mg/dL (ref 0.44–1.00)
GFR, Estimated: >60 mL/min (ref >60)
Glucose, Bld: 113 mg/dL — ABNORMAL HIGH (ref 70–99)
Potassium: 3.3 mmol/L — ABNORMAL LOW (ref 3.5–5.1)
Sodium: 136 mmol/L (ref 135–145)
Total Bilirubin: 0.2 mg/dL — ABNORMAL LOW (ref 0.3–1.2)
Total Protein: 6.3 g/dL — ABNORMAL LOW (ref 6.5–8.1)

## 2022-03-27 LAB — PROTEIN / CREATININE RATIO, URINE
Creatinine, Urine: 113 mg/dL
Protein Creatinine Ratio: 0.07 mg/mg{Cre} (ref 0.00–0.15)
Total Protein, Urine: 8 mg/dL

## 2022-03-27 NOTE — MAU Provider Note (Signed)
History     CSN: [id number]  Arrival date and time: 03/27/22 1558    Chief Complaint  Patient presents with   Hypertension   HPI This is a 27yo G2P1001 at 8w1dwith a history of chtn on labetalol 2072mTID. She was seen in the office today due to seeing floaters and black spots intermittently. Her BP was 150s/80s. No palliating or provoking factors. Occasional mild headache. No abdominal pain. She reports good fetal movement.   OB History     Gravida  2   Para  1   Term  1   Preterm      AB      Living  1      SAB      IAB      Ectopic      Multiple  0   Live Births  1           Past Medical History:  Diagnosis Date   Anemia    Anxiety    Chronic kidney disease    Frequent UTI's   Pregnancy induced hypertension     Past Surgical History:  Procedure Laterality Date   WISDOM TOOTH EXTRACTION Bilateral    Upper only    Family History  Problem Relation Age of Onset   Healthy Mother    Healthy Father     Social History   Tobacco Use   Smoking status: Never   Smokeless tobacco: Never  Vaping Use   Vaping Use: Never used  Substance Use Topics   Alcohol use: Not Currently    Comment: Occasional   Drug use: Never    Allergies:  Allergies  Allergen Reactions   Sulfa Antibiotics Other (See Comments)    unknown    Medications Prior to Admission  Medication Sig Dispense Refill Last Dose   labetalol (NORMODYNE) 200 MG tablet Take 1 tablet (200 mg total) by mouth 3 (three) times daily. 270 tablet 3 03/27/2022   Prenatal Vit-Fe Fumarate-FA (MULTIVITAMIN-PRENATAL) 27-0.8 MG TABS tablet Take 1 tablet by mouth daily at 12 noon.   03/27/2022    Review of Systems Physical Exam   Blood pressure 132/82, pulse 99, temperature 98 F (36.7 C), resp. rate 16, height 5' 7"$  (1.702 m), weight 87.5 kg, SpO2 99 %, unknown if currently breastfeeding. Patient Vitals for the past 24 hrs:  BP Temp Pulse Resp SpO2 Height Weight  03/27/22 1730 131/75 --  (!) 103 -- -- -- --  03/27/22 1729 -- -- -- -- 99 % -- --  03/27/22 1724 -- -- -- -- 99 % -- --  03/27/22 1719 -- -- -- -- 99 % -- --  03/27/22 1715 124/74 -- 92 -- -- -- --  03/27/22 1714 -- -- -- -- 98 % -- --  03/27/22 1709 -- -- -- -- 100 % -- --  03/27/22 1700 121/72 -- 99 -- -- -- --  03/27/22 1659 -- -- -- -- 99 % -- --  03/27/22 1654 -- -- -- -- 99 % -- --  03/27/22 1649 -- -- -- -- 99 % -- --  03/27/22 1645 127/72 -- (!) 107 -- -- -- --  03/27/22 1644 -- -- -- -- 99 % -- --  03/27/22 1634 -- -- -- -- 98 % -- --  03/27/22 1630 132/82 -- 99 -- -- -- --  03/27/22 1629 -- -- -- -- 99 % -- --  03/27/22 1624 -- -- -- -- 99 % -- --  03/27/22 1619  124/71 98 F (36.7 C) (!) 103 16 99 % 5' 7"$  (1.702 m) 87.5 kg    Physical Exam Vitals reviewed.  Constitutional:      Appearance: Normal appearance.  Cardiovascular:     Rate and Rhythm: Normal rate and regular rhythm.  Pulmonary:     Effort: Pulmonary effort is normal.  Abdominal:     General: Abdomen is flat.     Palpations: Abdomen is soft.     Tenderness: There is no abdominal tenderness. There is no guarding or rebound.     Hernia: No hernia is present.  Skin:    Capillary Refill: Capillary refill takes less than 2 seconds.  Neurological:     General: No focal deficit present.     Mental Status: She is alert.  Psychiatric:        Mood and Affect: Mood normal.        Behavior: Behavior normal.        Thought Content: Thought content normal.        Judgment: Judgment normal.    Results for orders placed or performed during the hospital encounter of 03/27/22 (from the past 24 hour(s))  Comprehensive metabolic panel     Status: Abnormal   Collection Time: 03/27/22  4:27 PM  Result Value Ref Range   Sodium 136 135 - 145 mmol/L   Potassium 3.3 (L) 3.5 - 5.1 mmol/L   Chloride 107 98 - 111 mmol/L   CO2 21 (L) 22 - 32 mmol/L   Glucose, Bld 113 (H) 70 - 99 mg/dL   BUN <5 (L) 6 - 20 mg/dL   Creatinine, Ser 0.55 0.44 - 1.00  mg/dL   Calcium 8.3 (L) 8.9 - 10.3 mg/dL   Total Protein 6.3 (L) 6.5 - 8.1 g/dL   Albumin 2.9 (L) 3.5 - 5.0 g/dL   AST 18 15 - 41 U/L   ALT 18 0 - 44 U/L   Alkaline Phosphatase 61 38 - 126 U/L   Total Bilirubin 0.2 (L) 0.3 - 1.2 mg/dL   GFR, Estimated >60 >60 mL/min   Anion gap 8 5 - 15  CBC     Status: Abnormal   Collection Time: 03/27/22  4:27 PM  Result Value Ref Range   WBC 6.6 4.0 - 10.5 K/uL   RBC 4.23 3.87 - 5.11 MIL/uL   Hemoglobin 11.2 (L) 12.0 - 15.0 g/dL   HCT 34.0 (L) 36.0 - 46.0 %   MCV 80.4 80.0 - 100.0 fL   MCH 26.5 26.0 - 34.0 pg   MCHC 32.9 30.0 - 36.0 g/dL   RDW 21.2 (H) 11.5 - 15.5 %   Platelets 260 150 - 400 K/uL   nRBC 0.0 0.0 - 0.2 %  Protein / creatinine ratio, urine     Status: None   Collection Time: 03/27/22  5:11 PM  Result Value Ref Range   Creatinine, Urine 113 mg/dL   Total Protein, Urine 8 mg/dL   Protein Creatinine Ratio 0.07 0.00 - 0.15 mg/mg[Cre]     MAU Course  Procedures NST:  Baseline: 145  Variability: moderate Accelerations: 10x10  Decelerations: none Contractions:   MDM   Assessment and Plan   1. [redacted] weeks gestation of pregnancy   2. Chronic hypertension affecting pregnancy    BP normal. Labs reassuring. Discharge to home.  Truett Mainland 03/27/2022, 4:32 PM

## 2022-03-27 NOTE — MAU Note (Signed)
.  Suzanne Shaffer is a 27 y.o. at 78w1dhere in MAU reporting: she was sent from office due to elevated b/p (158/86). Reports she was seen today because she was seeing floaters in her vision. Reports she is on labetalol 1043m tid. Reports positive fetal movement Onset of complaint: today Pain score: 0/10 Vitals:   03/27/22 1619  BP: 124/71  Pulse: (!) 103  Resp: 16  Temp: 98 F (36.7 C)  SpO2: 99%     FHT:149 Lab orders placed from triage:

## 2022-05-05 ENCOUNTER — Ambulatory Visit: Payer: Commercial Managed Care - PPO | Admitting: Cardiology

## 2022-05-25 ENCOUNTER — Encounter (HOSPITAL_COMMUNITY): Payer: Self-pay | Admitting: Obstetrics and Gynecology

## 2022-05-25 ENCOUNTER — Other Ambulatory Visit: Payer: Self-pay

## 2022-05-25 ENCOUNTER — Inpatient Hospital Stay (HOSPITAL_COMMUNITY)
Admission: AD | Admit: 2022-05-25 | Discharge: 2022-05-25 | Disposition: A | Discharge status: Home / Self Care | Payer: Commercial Managed Care - PPO | Attending: Obstetrics and Gynecology | Admitting: Obstetrics and Gynecology

## 2022-05-25 DIAGNOSIS — O10913 Unspecified pre-existing hypertension complicating pregnancy, third trimester: Secondary | ICD-10-CM

## 2022-05-25 DIAGNOSIS — O09293 Supervision of pregnancy with other poor reproductive or obstetric history, third trimester: Secondary | ICD-10-CM | POA: Diagnosis not present

## 2022-05-25 DIAGNOSIS — O133 Gestational [pregnancy-induced] hypertension without significant proteinuria, third trimester: Secondary | ICD-10-CM | POA: Insufficient documentation

## 2022-05-25 DIAGNOSIS — D691 Qualitative platelet defects: Secondary | ICD-10-CM | POA: Diagnosis not present

## 2022-05-25 DIAGNOSIS — Z3A34 34 weeks gestation of pregnancy: Secondary | ICD-10-CM | POA: Diagnosis not present

## 2022-05-25 DIAGNOSIS — O99113 Other diseases of the blood and blood-forming organs and certain disorders involving the immune mechanism complicating pregnancy, third trimester: Secondary | ICD-10-CM | POA: Insufficient documentation

## 2022-05-25 DIAGNOSIS — Z79899 Other long term (current) drug therapy: Secondary | ICD-10-CM | POA: Insufficient documentation

## 2022-05-25 LAB — CBC
HCT: 31.9 % — ABNORMAL LOW (ref 36.0–46.0)
Hemoglobin: 10.3 g/dL — ABNORMAL LOW (ref 12.0–15.0)
MCH: 25.1 pg — ABNORMAL LOW (ref 26.0–34.0)
MCHC: 32.3 g/dL (ref 30.0–36.0)
MCV: 77.8 fL — ABNORMAL LOW (ref 80.0–100.0)
Platelets: 239 10*3/uL (ref 150–400)
RBC: 4.1 MIL/uL (ref 3.87–5.11)
RDW: 15.4 % (ref 11.5–15.5)
WBC: 6.9 10*3/uL (ref 4.0–10.5)
nRBC: 0 % (ref 0.0–0.2)

## 2022-05-25 LAB — PROTEIN / CREATININE RATIO, URINE
Creatinine, Urine: 46 mg/dL
Total Protein, Urine: <6 mg/dL

## 2022-05-25 LAB — URINALYSIS, ROUTINE W REFLEX MICROSCOPIC
Bilirubin Urine: NEGATIVE
Glucose, UA: >=500 mg/dL — AB
Hgb urine dipstick: NEGATIVE
Ketones, ur: NEGATIVE mg/dL
Nitrite: NEGATIVE
Protein, ur: NEGATIVE mg/dL
Specific Gravity, Urine: 1.007 (ref 1.005–1.030)
pH: 5 (ref 5.0–8.0)

## 2022-05-25 LAB — COMPREHENSIVE METABOLIC PANEL
ALT: 14 U/L (ref 0–44)
AST: 17 U/L (ref 15–41)
Albumin: 2.6 g/dL — ABNORMAL LOW (ref 3.5–5.0)
Alkaline Phosphatase: 124 U/L (ref 38–126)
Anion gap: 9 (ref 5–15)
BUN: <5 mg/dL — ABNORMAL LOW (ref 6–20)
CO2: 22 mmol/L (ref 22–32)
Calcium: 8.7 mg/dL — ABNORMAL LOW (ref 8.9–10.3)
Chloride: 105 mmol/L (ref 98–111)
Creatinine, Ser: 0.59 mg/dL (ref 0.44–1.00)
GFR, Estimated: >60 mL/min (ref >60)
Glucose, Bld: 113 mg/dL — ABNORMAL HIGH (ref 70–99)
Potassium: 3.8 mmol/L (ref 3.5–5.1)
Sodium: 136 mmol/L (ref 135–145)
Total Bilirubin: <0.1 mg/dL — ABNORMAL LOW (ref 0.3–1.2)
Total Protein: 6.1 g/dL — ABNORMAL LOW (ref 6.5–8.1)

## 2022-05-25 MED ORDER — ACETAMINOPHEN 160 MG/5ML PO SOLN
1000.0000 mg | Freq: Once | ORAL | Status: AC
  Administered 2022-05-25: 1000 mg via ORAL
  Filled 2022-05-25: qty 40.6

## 2022-05-25 MED ORDER — BUTALBITAL-APAP-CAFFEINE 50-325-40 MG PO TABS
2.0000 | ORAL_TABLET | Freq: Once | ORAL | Status: DC
  Filled 2022-05-25: qty 2

## 2022-05-25 NOTE — MAU Note (Signed)
Teliah Buffalo is a 27 y.o. at [redacted]w[redacted]d here in MAU reporting: she took BP @ home, BP's, 154/103, 155/95, 163/90, and 156/96.  Reports has H/A and visual disturbances, denies epigastric pain.  States Tylenol for HA around noon, no relief noted.  States currently taking Labetalol 200 mg tid and Nifedipine 60 mg daily. Reports took Nifedipine for today and Labetalol @ approx 1415. Denies VB or LOF.  Reports +FM. LMP: NA Onset of complaint: today Pain score: 5 Vitals:   05/25/22 1723  BP: (!) 148/86  Pulse: (!) 105  Resp: 18  Temp: 97.6 F (36.4 C)  SpO2: 100%     FHT:135 bpm Lab orders placed from triage:   UA

## 2022-05-25 NOTE — MAU Provider Note (Addendum)
History     CSN: [id number]  Arrival date and time: 05/25/22 1629   Event Date/Time   First Provider Initiated Contact with Patient 05/25/22 1807      No chief complaint on file.  Suzanne Shaffer is a 27 y.o. G2P1001 at [redacted]w[redacted]d who is presenting with elevated blood pressures over the past several days, highest today measuring at 153/103. Of note, pt has a hx of gHTN throughout this and her last pregnancy. This pregnancy, she has been placed on labetalol, currently  TID, and more recently on procardia  since 04/15. She has been taking this as prescribed but she states over the past few days that her pressures have remained elevated despite taking her medication. She also notes that for the past three days she has had a constant, generalized headache. She tried to take Tylenol earlier today without relief of her pain. Additionally, she notes that Tuesday her legs began to swell significant but this has subsided to her usual baseline of swelling. As of yesterday, she has been experiencing an increase in black spots/visual floaters, and what she describes as a "film" over her vision. Denies acute visual losses. Of note, she experienced elevated pressures throughout her first pregnancy and had to be induced around [redacted] weeks gestation. She denies any focal neurologic deficits, weakness, fever, chest pain, shortness of breath, palpitations, vaginal bleeding, vaginal discharge.    OB History     Gravida  2   Para  1   Term  1   Preterm      AB      Living  1      SAB      IAB      Ectopic      Multiple  0   Live Births  1           Past Medical History:  Diagnosis Date   Anemia    Anxiety    Chronic kidney disease    Frequent UTI's   Pregnancy induced hypertension     Past Surgical History:  Procedure Laterality Date   WISDOM TOOTH EXTRACTION Bilateral    Upper only    Family History  Problem Relation Age of Onset   Healthy Mother    Healthy Father      Social History   Tobacco Use   Smoking status: Never   Smokeless tobacco: Never  Vaping Use   Vaping Use: Never used  Substance Use Topics   Alcohol use: Not Currently    Comment: Occasional   Drug use: Never    Allergies:  Allergies  Allergen Reactions   Sulfa Antibiotics Other (See Comments)    unknown    Medications Prior to Admission  Medication Sig Dispense Refill Last Dose   labetalol (NORMODYNE) 200 MG tablet Take 1 tablet (200 mg total) by mouth 3 (three) times daily. 270 tablet 3 05/25/2022   NIFEdipine (PROCARDIA XL/NIFEDICAL XL) 60 MG 24 hr tablet Take 60 mg by mouth daily.   05/24/2022   Prenatal Vit-Fe Fumarate-FA (MULTIVITAMIN-PRENATAL) 27-0.8 MG TABS tablet Take 1 tablet by mouth daily at 12 noon.   05/25/2022    Review of Systems  Constitutional:  Positive for unexpected weight change. Negative for chills and fever.  Eyes:  Positive for visual disturbance.  Respiratory:  Negative for shortness of breath.   Cardiovascular:  Positive for leg swelling. Negative for chest pain.  Gastrointestinal:  Negative for abdominal pain, nausea and vomiting.  Genitourinary:  Negative for vaginal bleeding  and vaginal discharge.  Skin:  Negative for rash.  Neurological:  Positive for headaches.  All other systems reviewed and are negative.  Physical Exam   Blood pressure (!) 148/86, pulse (!) 105, temperature 97.6 F (36.4 C), temperature source Oral, resp. rate 18, height 5\' 7"  (1.702 m), weight 96.6 kg, SpO2 100 %, unknown if currently breastfeeding. Patient Vitals for the past 24 hrs:  BP Temp Temp src Pulse Resp SpO2 Height Weight  05/25/22 2016 129/77 -- -- 99 -- -- -- --  05/25/22 2001 133/74 -- -- (!) 101 -- -- -- --  05/25/22 1946 123/69 -- -- 94 -- -- -- --  05/25/22 1937 135/65 -- -- 99 -- -- -- --  05/25/22 1915 135/78 -- -- (!) 107 -- -- -- --  05/25/22 1900 133/80 -- -- (!) 104 -- -- -- --  05/25/22 1831 (!) 141/84 -- -- (!) 114 -- -- -- --  05/25/22  1816 (!) 145/84 -- -- 98 -- -- -- --  05/25/22 1800 (!) 156/84 -- -- (!) 115 -- -- -- --  05/25/22 1746 (!) 149/88 -- -- (!) 101 -- -- -- --  05/25/22 1739 (!) 144/88 -- -- (!) 103 -- -- -- --  05/25/22 1723 (!) 148/86 97.6 F (36.4 C) Oral (!) 105 18 100 % 5\' 7"  (1.702 m) 96.6 kg      Physical Exam Vitals and nursing note reviewed.  Constitutional:      General: She is not in acute distress.    Appearance: Normal appearance. She is not ill-appearing.  HENT:     Head: Normocephalic and atraumatic.  Eyes:     Extraocular Movements: Extraocular movements intact.  Cardiovascular:     Rate and Rhythm: Regular rhythm. Tachycardia present.     Pulses: Normal pulses.     Heart sounds: No murmur heard.    No friction rub. No gallop.  Pulmonary:     Effort: Pulmonary effort is normal. No respiratory distress.     Breath sounds: Normal breath sounds. No wheezing, rhonchi or rales.  Abdominal:     Tenderness: There is no abdominal tenderness. There is no guarding or rebound.     Comments: Gravid abdomen.   Musculoskeletal:     Cervical back: Normal range of motion.  Skin:    General: Skin is warm and dry.     Capillary Refill: Capillary refill takes less than 2 seconds.  Neurological:     Mental Status: She is alert. Mental status is at baseline.     Comments: No gross deficit.   Psychiatric:        Mood and Affect: Mood normal.        Behavior: Behavior normal.    MAU Course  Procedures  MDM Pt is a 26yoF G2P1001 at [redacted]w[redacted]d presenting for concern for worsening CHTN vs Pre-E in pregnancy. Of note, she presented similarly at the end of her first pregnancy ultimately resulting in IOL at 37w. Pressures in the MAU at 148/86 with mild tachycardia at 105. FHR monitoring is reassuring. Workup today including UA, CMP, P/cr ratio, CBC. BP's C/W or even lower than those in office. Labs Nml. Exam benign. HA did not completely resolve w/ Tylenol, but was relatively mild to begin with. Pt was  offered Fioricet but states she can't take pills. She requests to go home and says she's feeling fine. Discussed Hx, labs, exam including 5/5 HA w/ Dr. Alysia Penna. Agrees w/ POC to D/C.    Assessment  and Plan   #CHTN at 34w of pregnancy  -CBC with evidence of platelet dysfunction, CMP, Prot/Cr, and UA are reassuring.   -Tylenol for her HA, encouraged hydration PO  8:32 PM -Pressures went down in MAU after hydration and rest.  -HA not improved but stable at 5/10. Pt notes she would like to leave. Discussed case with attending -D/c home, return precautions discussed.    Rosario Adie, MS3 [facility]  05/25/22 6:40 PM   EFM: Baseline: 145 bpm, Variability: moderate {> 6 bpm), Accelerations: Reactive, and Decelerations: Absent Toco: irreg, mild  Katrinka Blazing, [place], CNM 05/25/2022 9:12 PM   Katrinka Blazing, [place], CNM 05/25/2022 9:10 PM

## 2022-05-25 NOTE — Discharge Instructions (Addendum)
You were evaluated today in the Maternal Assessment Unit (MAU) for hypertension in the third trimester of pregnancy. At this time, your lab work is reassuring and does not show evidence of pre-eclampsia or organ damage. Continue to take your high blood pressure medications as prescribed, monitor your blood pressure at home, and remember to remain well hydrated. We recommend that you follow up with your prenatal care provider as scheduled for reassessment. It was a pleasure meeting you today and we hope that you have a wonderful evening.

## 2022-06-01 ENCOUNTER — Observation Stay (HOSPITAL_COMMUNITY)
Admission: AD | Admit: 2022-06-01 | Discharge: 2022-06-02 | Disposition: A | Discharge status: Home / Self Care | Payer: Commercial Managed Care - PPO | Attending: Obstetrics and Gynecology | Admitting: Obstetrics and Gynecology

## 2022-06-01 ENCOUNTER — Encounter (HOSPITAL_COMMUNITY): Payer: Self-pay | Admitting: Obstetrics and Gynecology

## 2022-06-01 ENCOUNTER — Other Ambulatory Visit: Payer: Self-pay

## 2022-06-01 DIAGNOSIS — Z3A37 37 weeks gestation of pregnancy: Principal | ICD-10-CM

## 2022-06-01 DIAGNOSIS — O10913 Unspecified pre-existing hypertension complicating pregnancy, third trimester: Secondary | ICD-10-CM | POA: Diagnosis present

## 2022-06-01 DIAGNOSIS — O10213 Pre-existing hypertensive chronic kidney disease complicating pregnancy, third trimester: Principal | ICD-10-CM | POA: Insufficient documentation

## 2022-06-01 DIAGNOSIS — N189 Chronic kidney disease, unspecified: Secondary | ICD-10-CM | POA: Diagnosis not present

## 2022-06-01 DIAGNOSIS — Z3A35 35 weeks gestation of pregnancy: Secondary | ICD-10-CM | POA: Diagnosis not present

## 2022-06-01 DIAGNOSIS — I10 Essential (primary) hypertension: Secondary | ICD-10-CM

## 2022-06-01 DIAGNOSIS — I129 Hypertensive chronic kidney disease with stage 1 through stage 4 chronic kidney disease, or unspecified chronic kidney disease: Secondary | ICD-10-CM | POA: Diagnosis not present

## 2022-06-01 LAB — COMPREHENSIVE METABOLIC PANEL
ALT: 16 U/L (ref 0–44)
AST: 16 U/L (ref 15–41)
Albumin: 2.7 g/dL — ABNORMAL LOW (ref 3.5–5.0)
Alkaline Phosphatase: 138 U/L — ABNORMAL HIGH (ref 38–126)
Anion gap: 9 (ref 5–15)
BUN: <5 mg/dL — ABNORMAL LOW (ref 6–20)
CO2: 22 mmol/L (ref 22–32)
Calcium: 8.8 mg/dL — ABNORMAL LOW (ref 8.9–10.3)
Chloride: 104 mmol/L (ref 98–111)
Creatinine, Ser: 0.48 mg/dL (ref 0.44–1.00)
GFR, Estimated: >60 mL/min (ref >60)
Glucose, Bld: 85 mg/dL (ref 70–99)
Potassium: 3.5 mmol/L (ref 3.5–5.1)
Sodium: 135 mmol/L (ref 135–145)
Total Bilirubin: 0.5 mg/dL (ref 0.3–1.2)
Total Protein: 6.4 g/dL — ABNORMAL LOW (ref 6.5–8.1)

## 2022-06-01 LAB — CBC WITH DIFFERENTIAL/PLATELET
Abs Immature Granulocytes: 0.03 10*3/uL (ref 0.00–0.07)
Basophils Absolute: 0 10*3/uL (ref 0.0–0.1)
Basophils Relative: 0 %
Eosinophils Absolute: 0 10*3/uL (ref 0.0–0.5)
Eosinophils Relative: 0 %
HCT: 34.3 % — ABNORMAL LOW (ref 36.0–46.0)
Hemoglobin: 10.8 g/dL — ABNORMAL LOW (ref 12.0–15.0)
Immature Granulocytes: 1 %
Lymphocytes Relative: 23 %
Lymphs Abs: 1.5 10*3/uL (ref 0.7–4.0)
MCH: 25.1 pg — ABNORMAL LOW (ref 26.0–34.0)
MCHC: 31.5 g/dL (ref 30.0–36.0)
MCV: 79.8 fL — ABNORMAL LOW (ref 80.0–100.0)
Monocytes Absolute: 0.5 10*3/uL (ref 0.1–1.0)
Monocytes Relative: 8 %
Neutro Abs: 4.5 10*3/uL (ref 1.7–7.7)
Neutrophils Relative %: 68 %
Platelets: 225 10*3/uL (ref 150–400)
RBC: 4.3 MIL/uL (ref 3.87–5.11)
RDW: 15.5 % (ref 11.5–15.5)
WBC: 6.5 10*3/uL (ref 4.0–10.5)
nRBC: 0 % (ref 0.0–0.2)

## 2022-06-01 LAB — TYPE AND SCREEN
ABO/RH(D): A POS
Antibody Screen: NEGATIVE

## 2022-06-01 LAB — PROTEIN / CREATININE RATIO, URINE
Creatinine, Urine: 41 mg/dL
Protein Creatinine Ratio: 0.17 mg/mg{Cre} — ABNORMAL HIGH (ref 0.00–0.15)
Total Protein, Urine: 7 mg/dL

## 2022-06-01 LAB — OB RESULTS CONSOLE GBS: GBS: NEGATIVE

## 2022-06-01 MED ORDER — LABETALOL HCL 5 MG/ML IV SOLN: 20.0000 mg | INTRAVENOUS | Status: DC | PRN

## 2022-06-01 MED ORDER — ACETAMINOPHEN 325 MG PO TABS
650.0000 mg | ORAL_TABLET | ORAL | Status: DC | PRN
  Filled 2022-06-01: qty 2

## 2022-06-01 MED ORDER — BETAMETHASONE SOD PHOS & ACET 6 (3-3) MG/ML IJ SUSP: 12.0000 mg | INTRAMUSCULAR | Status: DC

## 2022-06-01 MED ORDER — LABETALOL HCL 5 MG/ML IV SOLN: 80.0000 mg | INTRAVENOUS | Status: DC | PRN

## 2022-06-01 MED ORDER — NIFEDIPINE ER OSMOTIC RELEASE 60 MG PO TB24
60.0000 mg | ORAL_TABLET | Freq: Every day | ORAL | Status: DC
  Administered 2022-06-01 – 2022-06-02 (×2): 60 mg via ORAL
  Filled 2022-06-01 (×2): qty 1

## 2022-06-01 MED ORDER — LACTATED RINGERS IV SOLN: 125.0000 mL/h | INTRAVENOUS | Status: DC

## 2022-06-01 MED ORDER — LABETALOL HCL 5 MG/ML IV SOLN: 40.0000 mg | INTRAVENOUS | Status: DC | PRN

## 2022-06-01 MED ORDER — ACETAMINOPHEN 160 MG/5ML PO SOLN
650.0000 mg | ORAL | Status: DC | PRN
  Administered 2022-06-01: 650 mg via ORAL
  Filled 2022-06-01: qty 20.3

## 2022-06-01 MED ORDER — HYDRALAZINE HCL 20 MG/ML IJ SOLN: 10.0000 mg | INTRAMUSCULAR | Status: DC | PRN

## 2022-06-01 MED ORDER — PRENATAL MULTIVITAMIN CH: 1.0000 | ORAL_TABLET | Freq: Every day | ORAL | Status: DC

## 2022-06-01 MED ORDER — LABETALOL HCL 200 MG PO TABS
400.0000 mg | ORAL_TABLET | Freq: Three times a day (TID) | ORAL | Status: DC
  Administered 2022-06-01 – 2022-06-02 (×3): 400 mg via ORAL
  Filled 2022-06-01 (×3): qty 2

## 2022-06-01 MED ORDER — DOCUSATE SODIUM 100 MG PO CAPS
100.0000 mg | ORAL_CAPSULE | Freq: Every day | ORAL | Status: DC
  Filled 2022-06-01: qty 1

## 2022-06-01 MED ORDER — CALCIUM CARBONATE ANTACID 500 MG PO CHEW: 2.0000 | CHEWABLE_TABLET | ORAL | Status: DC | PRN

## 2022-06-01 NOTE — H&P (Addendum)
Suzanne Shaffer is a 27 y.o. G2P1001 at [redacted]w[redacted]d presenting as a direct admit from the office for elevated Bps (one severe range). She has chronic HTN on labetalol  TID and a hx of gHTN. Since her arrival, BP has been mild range. Currently, she reports feeling well - no HA, RUQ pain, vision changes. She has not felt the best over the last week however - at home intermittently has worse swelling and headaches throughout the day. Has also been nauseous and vomiting at home - currently no nausea. Feeling excellent FM today. Growth [place] in the office today with EFW >97%ile (3839g), BPP 8/8.  OB History     Gravida  2   Para  1   Term  1   Preterm      AB      Living  1      SAB      IAB      Ectopic      Multiple  0   Live Births  1          Past Medical History:  Diagnosis Date   Anemia    Anxiety    Chronic kidney disease    Frequent UTI's   Pregnancy induced hypertension    Past Surgical History:  Procedure Laterality Date   WISDOM TOOTH EXTRACTION Bilateral    Upper only   Family History: family history includes Healthy in her father and mother. Social History:  reports that she has never smoked. She has never used smokeless tobacco. She reports that she does not currently use alcohol. She reports that she does not use drugs.     Maternal Diabetes: No Genetic Screening: Normal Maternal Ultrasounds/Referrals: Normal Fetal Ultrasounds or other Referrals:  None Maternal Substance Abuse:  No Significant Maternal Medications:  Labetalol Significant Maternal Lab Results:  None Number of Prenatal Visits:greater than 3 verified prenatal visits Other Comments:  None     06/01/2022   12:00 PM 05/25/2022    8:16 PM 05/25/2022    8:01 PM  Vitals with BMI  Height     Weight 215 lbs    BMI 33.67    Systolic 132 129 161  Diastolic 96 77 74  Pulse 113 99 101   Blood pressure (!) 132/96, pulse (!) 113, temperature 98.1 F (36.7 C), temperature source Oral,  resp. rate 20, height  (1.702 m), weight 97.5 kg, SpO2 98 %, unknown if currently breastfeeding.  Vitals and nursing note reviewed. Exam conducted with a chaperone present.  Constitutional:      Appearance: Normal appearance.  HENT:     Head: Normocephalic.  Eyes:     Pupils: Pupils are equal, round Cardiovascular:     Rate and Rhythm: Normal rate and regular rhythm.     Pulses: Normal pulses.  Abdominal:     General: Abdomen is Gravid, nontender Neurological:     Mental Status: She is alert.  Extremities: 1+ edema bilaterally  Prenatal labs: labs reviewed in [facility] ABO, Rh:   Antibody:   Rubella:   RPR:    HBsAg:    HIV:    GBS:     Assessment/Plan: Suzanne Shaffer is a 27 y.o. G2P1001 at [redacted]w[redacted]d admitted for BP monitoring and uptitration of BP meds in setting of cHTN with concern for superimposed preeclampsia.  CBC, CMP, UPC pending. Asymptomatic from preE perspective currently BP currently mild range - will increase home labetalol from  TID >  TID. Continue Procardia 60 qHS.  Discussed low threshold for magnesium/delivery if any persistent severe features or severe range Bps - ideally would make it to 37wga. See counseling re MOD as below. Daily CBC, CMP. Prn IV BP meds ordered I discussed with her that she will likely remain inpatient until delivery given lability of Bps outpatient.  Fetal: Growth [place] in the office today with EFW >97%ile (3839g), BPP 8/8 - will plan to repeat BPP in a week We discussed MOD given large EFW - discussed risks including shoulder dystocia. She is amenable to c-section, will consider. NST daily Declined late preterm steroids after counseling.  Dispo: inpatient for BP monitoring  Tawni Levy 06/01/2022, 12:33 PM

## 2022-06-01 NOTE — Plan of Care (Signed)
  Problem: Education: Goal: Knowledge of General Education information will improve Description: Including pain rating scale, medication(s)/side effects and non-pharmacologic comfort measures Outcome: Completed/Met   

## 2022-06-02 DIAGNOSIS — O10213 Pre-existing hypertensive chronic kidney disease complicating pregnancy, third trimester: Secondary | ICD-10-CM | POA: Diagnosis not present

## 2022-06-02 LAB — COMPREHENSIVE METABOLIC PANEL
ALT: 14 U/L (ref 0–44)
AST: 16 U/L (ref 15–41)
Albumin: 2.3 g/dL — ABNORMAL LOW (ref 3.5–5.0)
Alkaline Phosphatase: 111 U/L (ref 38–126)
Anion gap: 12 (ref 5–15)
BUN: <5 mg/dL — ABNORMAL LOW (ref 6–20)
CO2: 22 mmol/L (ref 22–32)
Calcium: 8.5 mg/dL — ABNORMAL LOW (ref 8.9–10.3)
Chloride: 101 mmol/L (ref 98–111)
Creatinine, Ser: 0.52 mg/dL (ref 0.44–1.00)
GFR, Estimated: >60 mL/min (ref >60)
Glucose, Bld: 87 mg/dL (ref 70–99)
Potassium: 3.8 mmol/L (ref 3.5–5.1)
Sodium: 135 mmol/L (ref 135–145)
Total Bilirubin: 0.4 mg/dL (ref 0.3–1.2)
Total Protein: 5.6 g/dL — ABNORMAL LOW (ref 6.5–8.1)

## 2022-06-02 LAB — CBC
HCT: 30.2 % — ABNORMAL LOW (ref 36.0–46.0)
Hemoglobin: 9.7 g/dL — ABNORMAL LOW (ref 12.0–15.0)
MCH: 25.3 pg — ABNORMAL LOW (ref 26.0–34.0)
MCHC: 32.1 g/dL (ref 30.0–36.0)
MCV: 78.6 fL — ABNORMAL LOW (ref 80.0–100.0)
Platelets: 208 10*3/uL (ref 150–400)
RBC: 3.84 MIL/uL — ABNORMAL LOW (ref 3.87–5.11)
RDW: 15.4 % (ref 11.5–15.5)
WBC: 6.5 10*3/uL (ref 4.0–10.5)
nRBC: 0 % (ref 0.0–0.2)

## 2022-06-02 MED ORDER — LABETALOL HCL 200 MG PO TABS: 400.0000 mg | ORAL_TABLET | Freq: Three times a day (TID) | ORAL | 1 refills | Status: DC

## 2022-06-02 NOTE — Plan of Care (Signed)
  Problem: Health Behavior/Discharge Planning: Goal: Ability to manage health-related needs will improve Outcome: Adequate for Discharge   Problem: Clinical Measurements: Goal: Ability to maintain clinical measurements within normal limits will improve Outcome: Adequate for Discharge Goal: Will remain free from infection Outcome: Adequate for Discharge Goal: Diagnostic test results will improve Outcome: Adequate for Discharge Goal: Respiratory complications will improve Outcome: Adequate for Discharge Goal: Cardiovascular complication will be avoided Outcome: Adequate for Discharge   Problem: Activity: Goal: Risk for activity intolerance will decrease Outcome: Adequate for Discharge   Problem: Nutrition: Goal: Adequate nutrition will be maintained Outcome: Adequate for Discharge   Problem: Coping: Goal: Level of anxiety will decrease Outcome: Adequate for Discharge   Problem: Elimination: Goal: Will not experience complications related to bowel motility Outcome: Adequate for Discharge Goal: Will not experience complications related to urinary retention Outcome: Adequate for Discharge   Problem: Pain Managment: Goal: General experience of comfort will improve Outcome: Adequate for Discharge   Problem: Safety: Goal: Ability to remain free from injury will improve Outcome: Adequate for Discharge   Problem: Skin Integrity: Goal: Risk for impaired skin integrity will decrease Outcome: Adequate for Discharge   Problem: Education: Goal: Knowledge of disease or condition will improve Outcome: Adequate for Discharge Goal: Knowledge of the prescribed therapeutic regimen will improve Outcome: Adequate for Discharge Goal: Individualized Educational Video(s) Outcome: Adequate for Discharge   Problem: Clinical Measurements: Goal: Complications related to the disease process, condition or treatment will be avoided or minimized Outcome: Adequate for Discharge   Problem:  Education: Goal: Knowledge of disease or condition will improve Outcome: Adequate for Discharge Goal: Knowledge of the prescribed therapeutic regimen will improve Outcome: Adequate for Discharge   Problem: Fluid Volume: Goal: Peripheral tissue perfusion will improve Outcome: Adequate for Discharge   Problem: Clinical Measurements: Goal: Complications related to disease process, condition or treatment will be avoided or minimized Outcome: Adequate for Discharge

## 2022-06-02 NOTE — Discharge Summary (Signed)
Admission Diagnosis: IUP at 35 w 4 day Chronic Hypertension  Discharge Diagnosis: Same  Hospital Course: 27 year old G 2 P 1001 at 35 w 5 days with known chronic HTN. Admitted yesterday after BP noted to be up in the office/MAU. Some headache and now has resolved. EFW greater than 97 % - and patient had a difficult 36 week vaginal delivery last time - baby hard to come out after 3 hours of pushing and vacuum.   Patient admitted and Labetalol adjusted - Labs did not indicated Superimposed Preeclampsia.  BP was not in severe range  Results for orders placed or performed during the hospital encounter of 06/01/22 (from the past 24 hour(s))  Type and screen [facility]     Status: None   Collection Time: 06/01/22  1:17 PM  Result Value Ref Range   ABO/RH(D) A POS    Antibody Screen NEG    Sample Expiration      06/04/2022,2359 Performed at [facility], [address], [place], [place] [address]   Protein / creatinine ratio, urine     Status: Abnormal   Collection Time: 06/01/22  1:17 PM  Result Value Ref Range   Creatinine, Urine 41 mg/dL   Total Protein, Urine 7 mg/dL   Protein Creatinine Ratio 0.17 (H) 0.00 - 0.15 mg/mg[Cre]  CBC with Differential/Platelet     Status: Abnormal   Collection Time: 06/01/22  1:20 PM  Result Value Ref Range   WBC 6.5 4.0 - 10.5 K/uL   RBC 4.30 3.87 - 5.11 MIL/uL   Hemoglobin 10.8 (L) 12.0 - 15.0 g/dL   HCT 69.6 (L) 29.5 - 28.4 %   MCV 79.8 (L) 80.0 - 100.0 fL   MCH 25.1 (L) 26.0 - 34.0 pg   MCHC 31.5 30.0 - 36.0 g/dL   RDW 13.2 44.0 - 10.2 %   Platelets 225 150 - 400 K/uL   nRBC 0.0 0.0 - 0.2 %   Neutrophils Relative % 68 %   Neutro Abs 4.5 1.7 - 7.7 K/uL   Lymphocytes Relative 23 %   Lymphs Abs 1.5 0.7 - 4.0 K/uL   Monocytes Relative 8 %   Monocytes Absolute 0.5 0.1 - 1.0 K/uL   Eosinophils Relative 0 %   Eosinophils Absolute 0.0 0.0 - 0.5 K/uL   Basophils Relative 0 %   Basophils Absolute 0.0 0.0 - 0.1 K/uL    Immature Granulocytes 1 %   Abs Immature Granulocytes 0.03 0.00 - 0.07 K/uL  Comprehensive metabolic panel     Status: Abnormal   Collection Time: 06/01/22  1:20 PM  Result Value Ref Range   Sodium 135 135 - 145 mmol/L   Potassium 3.5 3.5 - 5.1 mmol/L   Chloride 104 98 - 111 mmol/L   CO2 22 22 - 32 mmol/L   Glucose, Bld 85 70 - 99 mg/dL   BUN <5 (L) 6 - 20 mg/dL   Creatinine, Ser 7.25 0.44 - 1.00 mg/dL   Calcium 8.8 (L) 8.9 - 10.3 mg/dL   Total Protein 6.4 (L) 6.5 - 8.1 g/dL   Albumin 2.7 (L) 3.5 - 5.0 g/dL   AST 16 15 - 41 U/L   ALT 16 0 - 44 U/L   Alkaline Phosphatase 138 (H) 38 - 126 U/L   Total Bilirubin 0.5 0.3 - 1.2 mg/dL   GFR, Estimated >36 >64 mL/min   Anion gap 9 5 - 15  Comprehensive metabolic panel     Status:  Abnormal   Collection Time: 06/02/22  5:19 AM  Result Value Ref Range   Sodium 135 135 - 145 mmol/L   Potassium 3.8 3.5 - 5.1 mmol/L   Chloride 101 98 - 111 mmol/L   CO2 22 22 - 32 mmol/L   Glucose, Bld 87 70 - 99 mg/dL   BUN <5 (L) 6 - 20 mg/dL   Creatinine, Ser 1.61 0.44 - 1.00 mg/dL   Calcium 8.5 (L) 8.9 - 10.3 mg/dL   Total Protein 5.6 (L) 6.5 - 8.1 g/dL   Albumin 2.3 (L) 3.5 - 5.0 g/dL   AST 16 15 - 41 U/L   ALT 14 0 - 44 U/L   Alkaline Phosphatase 111 38 - 126 U/L   Total Bilirubin 0.4 0.3 - 1.2 mg/dL   GFR, Estimated >09 >60 mL/min   Anion gap 12 5 - 15  CBC     Status: Abnormal   Collection Time: 06/02/22  5:19 AM  Result Value Ref Range   WBC 6.5 4.0 - 10.5 K/uL   RBC 3.84 (L) 3.87 - 5.11 MIL/uL   Hemoglobin 9.7 (L) 12.0 - 15.0 g/dL   HCT 45.4 (L) 09.8 - 11.9 %   MCV 78.6 (L) 80.0 - 100.0 fL   MCH 25.3 (L) 26.0 - 34.0 pg   MCHC 32.1 30.0 - 36.0 g/dL   RDW 14.7 82.9 - 56.2 %   Platelets 208 150 - 400 K/uL   nRBC 0.0 0.0 - 0.2 %   On exam she has minimal edema.   We had an extensive discussion - she does not have superimiposed preeclampsia.  Option of discharge home on Labetalol 400 and Procardia 60 mg given - She would like to do that   Advised to return if Diastolics at home greater than 105 Follow up Monday for OV  [facility] and will schedule C Section for LGA/History of difficult delivery at 37 weeks  All questions are answered  She is aware that she may bounce back before delivery

## 2022-06-06 ENCOUNTER — Encounter (HOSPITAL_COMMUNITY): Payer: Self-pay

## 2022-06-06 NOTE — Patient Instructions (Signed)
Suzanne Shaffer  06/06/2022   Your procedure is scheduled on:  06/12/2022  Arrive at 0800 at Entrance C on [employer] at [facility]  and [employer]. You are invited to use the FREE valet parking or use the Visitor's parking deck.  Pick up the phone at the desk and dial [phone].  Call this number if you have problems the morning of surgery: [phone]  Remember:   Do not eat food:(After Midnight) Desps de medianoche.  Do not drink clear liquids: (After Midnight) Desps de medianoche.  Take these medicines the morning of surgery with A SIP OF WATER:  Take nifedipine and labetalol as prescribed   Do not wear jewelry, make-up or nail polish.  Do not wear lotions, powders, or perfumes. Do not wear deodorant.  Do not shave 48 hours prior to surgery.  Do not bring valuables to the hospital.  [facility] is not   responsible for any belongings or valuables brought to the hospital.  Contacts, dentures or bridgework may not be worn into surgery.  Leave suitcase in the car. After surgery it may be brought to your room.  For patients admitted to the hospital, checkout time is 11:00 AM the day of              discharge.      Please read over the following fact sheets that you were given:     Preparing for Surgery

## 2022-06-09 ENCOUNTER — Encounter (HOSPITAL_COMMUNITY)
Admit: 2022-06-09 | Discharge: 2022-06-09 | Disposition: A | Discharge status: Home / Self Care | Payer: Commercial Managed Care - PPO | Source: Ambulatory Visit | Attending: Obstetrics and Gynecology | Admitting: Obstetrics and Gynecology

## 2022-06-09 VITALS — Ht 67.0 in | Wt 214.0 lb

## 2022-06-09 DIAGNOSIS — Z3A36 36 weeks gestation of pregnancy: Secondary | ICD-10-CM

## 2022-06-09 DIAGNOSIS — Z01812 Encounter for preprocedural laboratory examination: Secondary | ICD-10-CM | POA: Insufficient documentation

## 2022-06-09 LAB — CBC
HCT: 34.2 % — ABNORMAL LOW (ref 36.0–46.0)
Hemoglobin: 10.8 g/dL — ABNORMAL LOW (ref 12.0–15.0)
MCH: 24.8 pg — ABNORMAL LOW (ref 26.0–34.0)
MCHC: 31.6 g/dL (ref 30.0–36.0)
MCV: 78.4 fL — ABNORMAL LOW (ref 80.0–100.0)
Platelets: 217 10*3/uL (ref 150–400)
RBC: 4.36 MIL/uL (ref 3.87–5.11)
RDW: 15.3 % (ref 11.5–15.5)
WBC: 6.3 10*3/uL (ref 4.0–10.5)
nRBC: 0 % (ref 0.0–0.2)

## 2022-06-09 LAB — TYPE AND SCREEN
ABO/RH(D): A POS
Antibody Screen: NEGATIVE

## 2022-06-09 LAB — RPR: RPR Ser Ql: NONREACTIVE

## 2022-06-12 ENCOUNTER — Inpatient Hospital Stay (HOSPITAL_COMMUNITY)
Admission: RE | Admit: 2022-06-12 | Discharge: 2022-06-14 | DRG: 788 | Disposition: A | Discharge status: Home / Self Care | Payer: Commercial Managed Care - PPO | Attending: Obstetrics and Gynecology | Admitting: Obstetrics and Gynecology

## 2022-06-12 ENCOUNTER — Other Ambulatory Visit: Payer: Self-pay

## 2022-06-12 ENCOUNTER — Encounter (HOSPITAL_COMMUNITY)
Admission: AD | Disposition: A | Discharge status: Home / Self Care | Payer: Self-pay | Source: Home / Self Care | Attending: Obstetrics and Gynecology

## 2022-06-12 ENCOUNTER — Inpatient Hospital Stay (HOSPITAL_COMMUNITY): Payer: Commercial Managed Care - PPO | Admitting: Anesthesiology

## 2022-06-12 ENCOUNTER — Encounter (HOSPITAL_COMMUNITY)
Admission: RE | Disposition: A | Discharge status: Home / Self Care | Payer: Self-pay | Source: Home / Self Care | Attending: Obstetrics and Gynecology

## 2022-06-12 ENCOUNTER — Encounter (HOSPITAL_COMMUNITY): Payer: Self-pay | Admitting: Obstetrics and Gynecology

## 2022-06-12 ENCOUNTER — Encounter: Payer: Self-pay | Admitting: Obstetrics and Gynecology

## 2022-06-12 DIAGNOSIS — O99344 Other mental disorders complicating childbirth: Secondary | ICD-10-CM | POA: Diagnosis not present

## 2022-06-12 DIAGNOSIS — F419 Anxiety disorder, unspecified: Secondary | ICD-10-CM

## 2022-06-12 DIAGNOSIS — O1092 Unspecified pre-existing hypertension complicating childbirth: Secondary | ICD-10-CM

## 2022-06-12 DIAGNOSIS — Z3A37 37 weeks gestation of pregnancy: Secondary | ICD-10-CM

## 2022-06-12 DIAGNOSIS — O3663X0 Maternal care for excessive fetal growth, third trimester, not applicable or unspecified: Secondary | ICD-10-CM | POA: Diagnosis present

## 2022-06-12 DIAGNOSIS — I10 Essential (primary) hypertension: Principal | ICD-10-CM

## 2022-06-12 DIAGNOSIS — O9902 Anemia complicating childbirth: Secondary | ICD-10-CM | POA: Diagnosis present

## 2022-06-12 DIAGNOSIS — Z98891 History of uterine scar from previous surgery: Secondary | ICD-10-CM

## 2022-06-12 DIAGNOSIS — O10919 Unspecified pre-existing hypertension complicating pregnancy, unspecified trimester: Secondary | ICD-10-CM | POA: Diagnosis present

## 2022-06-12 SURGERY — Surgical Case
Anesthesia: Regional

## 2022-06-12 SURGERY — Surgical Case
Anesthesia: Spinal

## 2022-06-12 MED ORDER — SODIUM CHLORIDE 0.9% FLUSH: 3.0000 mL | INTRAVENOUS | Status: DC | PRN

## 2022-06-12 MED ORDER — FENTANYL CITRATE (PF) 100 MCG/2ML IJ SOLN
INTRAMUSCULAR | Status: DC | PRN
  Administered 2022-06-12: 15 ug via INTRATHECAL

## 2022-06-12 MED ORDER — IBUPROFEN 600 MG PO TABS
600.0000 mg | ORAL_TABLET | Freq: Four times a day (QID) | ORAL | Status: DC
  Filled 2022-06-12: qty 1

## 2022-06-12 MED ORDER — POVIDONE-IODINE 10 % EX SWAB
2.0000 | Freq: Once | CUTANEOUS | Status: AC
  Administered 2022-06-12: 2 via TOPICAL

## 2022-06-12 MED ORDER — NALOXONE HCL 0.4 MG/ML IJ SOLN: 0.4000 mg | INTRAMUSCULAR | Status: DC | PRN

## 2022-06-12 MED ORDER — NALOXONE HCL 4 MG/10ML IJ SOLN: 1.0000 ug/kg/h | INTRAVENOUS | Status: DC | PRN

## 2022-06-12 MED ORDER — DEXAMETHASONE SODIUM PHOSPHATE 10 MG/ML IJ SOLN
INTRAMUSCULAR | Status: DC | PRN
  Administered 2022-06-12: 10 mg via INTRAVENOUS

## 2022-06-12 MED ORDER — IBUPROFEN 100 MG/5ML PO SUSP
600.0000 mg | Freq: Four times a day (QID) | ORAL | Status: DC
  Administered 2022-06-12 – 2022-06-14 (×9): 600 mg via ORAL
  Filled 2022-06-12 (×9): qty 30

## 2022-06-12 MED ORDER — OXYCODONE HCL 5 MG PO TABS: 5.0000 mg | ORAL_TABLET | ORAL | Status: DC | PRN

## 2022-06-12 MED ORDER — LABETALOL HCL 200 MG PO TABS
400.0000 mg | ORAL_TABLET | Freq: Three times a day (TID) | ORAL | Status: DC
  Administered 2022-06-12: 400 mg via ORAL
  Filled 2022-06-12: qty 2

## 2022-06-12 MED ORDER — DIPHENHYDRAMINE HCL 25 MG PO CAPS: 25.0000 mg | ORAL_CAPSULE | ORAL | Status: DC | PRN

## 2022-06-12 MED ORDER — PRENATAL MULTIVITAMIN CH: 1.0000 | ORAL_TABLET | Freq: Every day | ORAL | Status: DC

## 2022-06-12 MED ORDER — SIMETHICONE 80 MG PO CHEW
80.0000 mg | CHEWABLE_TABLET | Freq: Three times a day (TID) | ORAL | Status: DC
  Administered 2022-06-12 – 2022-06-14 (×6): 80 mg via ORAL
  Filled 2022-06-12 (×6): qty 1

## 2022-06-12 MED ORDER — KETOROLAC TROMETHAMINE 30 MG/ML IJ SOLN: 30.0000 mg | Freq: Once | INTRAMUSCULAR | Status: DC | PRN

## 2022-06-12 MED ORDER — SODIUM CHLORIDE 0.9 % IV SOLN
INTRAVENOUS | Status: AC
  Filled 2022-06-12: qty 2

## 2022-06-12 MED ORDER — OXYTOCIN-SODIUM CHLORIDE 30-0.9 UT/500ML-% IV SOLN
INTRAVENOUS | Status: DC | PRN
  Administered 2022-06-12: 30 [IU] via INTRAVENOUS

## 2022-06-12 MED ORDER — SODIUM CHLORIDE 0.9 % IV SOLN: 2.0000 g | INTRAVENOUS | Status: DC

## 2022-06-12 MED ORDER — ACETAMINOPHEN 325 MG PO TABS: 650.0000 mg | ORAL_TABLET | ORAL | Status: DC | PRN

## 2022-06-12 MED ORDER — MORPHINE SULFATE (PF) 0.5 MG/ML IJ SOLN
INTRAMUSCULAR | Status: AC
  Filled 2022-06-12: qty 10

## 2022-06-12 MED ORDER — COMPLETENATE 29-1 MG PO CHEW
1.0000 | CHEWABLE_TABLET | Freq: Every day | ORAL | Status: DC
  Administered 2022-06-13 – 2022-06-14 (×2): 1 via ORAL
  Filled 2022-06-12 (×2): qty 1

## 2022-06-12 MED ORDER — SCOPOLAMINE 1 MG/3DAYS TD PT72: 1.0000 | MEDICATED_PATCH | Freq: Once | TRANSDERMAL | Status: DC

## 2022-06-12 MED ORDER — PHENYLEPHRINE HCL-NACL 20-0.9 MG/250ML-% IV SOLN
INTRAVENOUS | Status: DC | PRN
  Administered 2022-06-12: 60 ug/min via INTRAVENOUS

## 2022-06-12 MED ORDER — FENTANYL CITRATE (PF) 100 MCG/2ML IJ SOLN
INTRAMUSCULAR | Status: AC
  Filled 2022-06-12: qty 2

## 2022-06-12 MED ORDER — COCONUT OIL OIL: 1.0000 | TOPICAL_OIL | Status: DC | PRN

## 2022-06-12 MED ORDER — SODIUM CHLORIDE 0.9 % IV SOLN
INTRAVENOUS | Status: DC | PRN
  Administered 2022-06-12: 2 g via INTRAVENOUS

## 2022-06-12 MED ORDER — BUPIVACAINE IN DEXTROSE 0.75-8.25 % IT SOLN
INTRATHECAL | Status: DC | PRN
  Administered 2022-06-12: 1.6 mL via INTRATHECAL

## 2022-06-12 MED ORDER — LACTATED RINGERS IV SOLN: INTRAVENOUS | Status: DC

## 2022-06-12 MED ORDER — ZOLPIDEM TARTRATE 5 MG PO TABS: 5.0000 mg | ORAL_TABLET | Freq: Every evening | ORAL | Status: DC | PRN

## 2022-06-12 MED ORDER — OXYTOCIN-SODIUM CHLORIDE 30-0.9 UT/500ML-% IV SOLN
2.5000 [IU]/h | INTRAVENOUS | Status: AC
  Administered 2022-06-12: 2.5 [IU]/h via INTRAVENOUS

## 2022-06-12 MED ORDER — ACETAMINOPHEN 10 MG/ML IV SOLN
INTRAVENOUS | Status: DC | PRN
  Administered 2022-06-12: 1000 mg via INTRAVENOUS

## 2022-06-12 MED ORDER — MENTHOL 3 MG MT LOZG: 1.0000 | LOZENGE | OROMUCOSAL | Status: DC | PRN

## 2022-06-12 MED ORDER — KETOROLAC TROMETHAMINE 30 MG/ML IJ SOLN
30.0000 mg | Freq: Once | INTRAMUSCULAR | Status: AC
  Administered 2022-06-12: 30 mg via INTRAVENOUS
  Filled 2022-06-12: qty 1

## 2022-06-12 MED ORDER — ONDANSETRON HCL 4 MG/2ML IJ SOLN
INTRAMUSCULAR | Status: DC | PRN
  Administered 2022-06-12: 4 mg via INTRAVENOUS

## 2022-06-12 MED ORDER — HYDROMORPHONE HCL 2 MG PO TABS: 2.0000 mg | ORAL_TABLET | ORAL | Status: DC | PRN

## 2022-06-12 MED ORDER — OXYCODONE HCL 5 MG/5ML PO SOLN
5.0000 mg | ORAL | Status: DC | PRN
  Administered 2022-06-12 – 2022-06-13 (×2): 5 mg via ORAL
  Administered 2022-06-13 (×3): 10 mg via ORAL
  Administered 2022-06-13: 5 mg via ORAL
  Administered 2022-06-14 (×3): 10 mg via ORAL
  Administered 2022-06-14: 5 mg via ORAL
  Filled 2022-06-12 (×2): qty 5
  Filled 2022-06-12 (×2): qty 10
  Filled 2022-06-12: qty 5
  Filled 2022-06-12 (×2): qty 10
  Filled 2022-06-12: qty 5
  Filled 2022-06-12 (×3): qty 10

## 2022-06-12 MED ORDER — SENNOSIDES-DOCUSATE SODIUM 8.6-50 MG PO TABS
2.0000 | ORAL_TABLET | Freq: Every day | ORAL | Status: DC
  Filled 2022-06-12: qty 2

## 2022-06-12 MED ORDER — DIBUCAINE (PERIANAL) 1 % EX OINT: 1.0000 | TOPICAL_OINTMENT | CUTANEOUS | Status: DC | PRN

## 2022-06-12 MED ORDER — LACTATED RINGERS IV SOLN: INTRAVENOUS | Status: DC | PRN

## 2022-06-12 MED ORDER — DIPHENHYDRAMINE HCL 25 MG PO CAPS: 25.0000 mg | ORAL_CAPSULE | Freq: Four times a day (QID) | ORAL | Status: DC | PRN

## 2022-06-12 MED ORDER — SODIUM CHLORIDE 0.9 % IV SOLN: INTRAVENOUS | Status: DC | PRN

## 2022-06-12 MED ORDER — DIPHENHYDRAMINE HCL 50 MG/ML IJ SOLN: 12.5000 mg | INTRAMUSCULAR | Status: DC | PRN

## 2022-06-12 MED ORDER — WITCH HAZEL-GLYCERIN EX PADS: 1.0000 | MEDICATED_PAD | CUTANEOUS | Status: DC | PRN

## 2022-06-12 MED ORDER — MORPHINE SULFATE (PF) 0.5 MG/ML IJ SOLN
INTRAMUSCULAR | Status: DC | PRN
  Administered 2022-06-12: 150 ug via INTRATHECAL

## 2022-06-12 MED ORDER — ONDANSETRON HCL 4 MG/2ML IJ SOLN
4.0000 mg | Freq: Three times a day (TID) | INTRAMUSCULAR | Status: DC | PRN
  Administered 2022-06-13: 4 mg via INTRAVENOUS
  Filled 2022-06-12: qty 2

## 2022-06-12 MED ORDER — MEPERIDINE HCL 25 MG/ML IJ SOLN: 6.2500 mg | INTRAMUSCULAR | Status: DC | PRN

## 2022-06-12 MED ORDER — SIMETHICONE 80 MG PO CHEW: 80.0000 mg | CHEWABLE_TABLET | ORAL | Status: DC | PRN

## 2022-06-12 SURGICAL SUPPLY — 35 items
APL PRP STRL LF DISP 70% ISPRP (MISCELLANEOUS) ×2
APL SKNCLS STERI-STRIP NONHPOA (GAUZE/BANDAGES/DRESSINGS) ×1
BARRIER ADHS 3X4 INTERCEED (GAUZE/BANDAGES/DRESSINGS) IMPLANT
BENZOIN TINCTURE PRP APPL 2/3 (GAUZE/BANDAGES/DRESSINGS) IMPLANT
BRR ADH 4X3 ABS CNTRL BYND (GAUZE/BANDAGES/DRESSINGS)
CHLORAPREP W/TINT 26 (MISCELLANEOUS) ×2 IMPLANT
CLAMP UMBILICAL CORD (MISCELLANEOUS) ×1 IMPLANT
CLOTH BEACON ORANGE TIMEOUT ST (SAFETY) ×1 IMPLANT
DRSG OPSITE POSTOP 4X10 (GAUZE/BANDAGES/DRESSINGS) ×1 IMPLANT
DRSG OPSITE POSTOP 4X8 (GAUZE/BANDAGES/DRESSINGS) IMPLANT
ELECT REM PT RETURN 9FT ADLT (ELECTROSURGICAL) ×1
ELECTRODE REM PT RTRN 9FT ADLT (ELECTROSURGICAL) ×1 IMPLANT
EXTRACTOR VACUUM M CUP 4 TUBE (SUCTIONS) IMPLANT
GLOVE BIO SURGEON STRL SZ 6.5 (GLOVE) ×1 IMPLANT
GLOVE BIOGEL PI IND STRL 7.0 (GLOVE) ×1 IMPLANT
GOWN STRL REUS W/TWL LRG LVL3 (GOWN DISPOSABLE) ×2 IMPLANT
KIT ABG SYR 3ML LUER SLIP (SYRINGE) IMPLANT
NDL HYPO 25X5/8 SAFETYGLIDE (NEEDLE) ×1 IMPLANT
NEEDLE HYPO 22GX1.5 SAFETY (NEEDLE) IMPLANT
NEEDLE HYPO 25X5/8 SAFETYGLIDE (NEEDLE) ×1 IMPLANT
NS IRRIG 1000ML POUR BTL (IV SOLUTION) ×1 IMPLANT
PACK C SECTION WH (CUSTOM PROCEDURE TRAY) ×1 IMPLANT
PAD OB MATERNITY 4.3X12.25 (PERSONAL CARE ITEMS) ×1 IMPLANT
STRIP CLOSURE SKIN 1/2X4 (GAUZE/BANDAGES/DRESSINGS) IMPLANT
SUT CHROMIC 0 CTX 36 (SUTURE) ×2 IMPLANT
SUT PLAIN 0 NONE (SUTURE) IMPLANT
SUT PLAIN 2 0 XLH (SUTURE) IMPLANT
SUT VIC AB 0 CT1 27 (SUTURE) ×3
SUT VIC AB 0 CT1 27XBRD ANBCTR (SUTURE) ×3 IMPLANT
SUT VIC AB 4-0 KS 27 (SUTURE) IMPLANT
SYR CONTROL 10ML LL (SYRINGE) IMPLANT
TOWEL OR 17X24 6PK STRL BLUE (TOWEL DISPOSABLE) ×1 IMPLANT
TRAY FOLEY W/BAG SLVR 14FR LF (SET/KITS/TRAYS/PACK) ×1 IMPLANT
VACUUM CUP M-STYLE MYSTIC II (SUCTIONS) IMPLANT
WATER STERILE IRR 1000ML POUR (IV SOLUTION) ×1 IMPLANT

## 2022-06-12 NOTE — Brief Op Note (Signed)
06/12/2022  10:11 AM  PATIENT:  Suzanne Shaffer  27 y.o. female  PRE-OPERATIVE DIAGNOSIS:  Chronic Hypertension and marcosomia large gestational age  POST-OPERATIVE DIAGNOSIS:  Chronic Hypertension and marcosomia large gestational age  PROCEDURE:  Procedure(s): CESAREAN SECTION (N/A)  SURGEON:  Surgeon(s) and Role:    * Marcelle Overlie, MD - Primary  PHYSICIAN ASSISTANT:   ASSISTANTS: none   ANESTHESIA:   spinal  EBL:  per anesthesia record   BLOOD ADMINISTERED:none  DRAINS: Urinary Catheter (Foley)   LOCAL MEDICATIONS USED:  NONE  SPECIMEN:  No Specimen  DISPOSITION OF SPECIMEN:  N/A  COUNTS:  YES  TOURNIQUET:  * No tourniquets in log *  DICTATION: .Other Dictation: Dictation Number dictated  PLAN OF CARE: Admit to inpatient   PATIENT DISPOSITION:  PACU - hemodynamically stable.   Delay start of Pharmacological VTE agent (>24hrs) due to surgical blood loss or risk of bleeding: not applicable

## 2022-06-12 NOTE — Op Note (Signed)
Suzanne Shaffer, BRANDE MEDICAL RECORD NO: [id number] ACCOUNT NO: [id number] DATE OF BIRTH: 06-16-1995 FACILITY: [facility] LOCATION: [facility] PHYSICIAN: Marcelino Duster L. Vincente Poli, MD  Operative Report   DATE OF PROCEDURE: 06/12/2022  PREOPERATIVE DIAGNOSIS:   1.  Intrauterine pregnancy 37 weeks and 1 day. 2.  Chronic hypertension. 3.  Large for gestational age.  POSTOPERATIVE DIAGNOSIS: 1.  Intrauterine pregnancy 37 weeks and 1 day. 2.  Chronic hypertension. 3.  Large for gestational age.  PROCEDURE:  Primary low transverse cesarean section.  SURGEON:  Meghann Landing L. Casha Estupinan, MD.  ANESTHESIA:  Spinal.  ESTIMATED BLOOD LOSS:  Less than 500 mL.  COMPLICATIONS:  None.  DRAINS:  Foley catheter.  PATHOLOGY:  None.  DESCRIPTION OF PROCEDURE:  The patient was taken to the operating room after consent was obtained.  She was then prepped and draped in the usual sterile fashion.  A timeout was performed.  Foley catheter had been placed.  A low transverse incision was  made, carried down to the fascia.  Fascia was scored in the middle and extended laterally.  The fascial incision was extended.  The peritoneum was entered bluntly.  The peritoneal incision was then stretched.  The bladder flap was created sharply and  then digitally.  The bladder blade was then readjusted.  A low transverse incision was made in the uterus.  The uterus was entered using hemostat.  Amniotic fluid was noted to be clear.  The baby was in cephalic presentation and was large for gestational  age, was delivered easily with a vacuum extractor. Apgars 8 at 1 minute and 9 at 5 minutes.  The baby was a female infant.  The cord was clamped and cut.  The baby was handed to the waiting neonatal team.  The placenta was manually removed, noted to be  normal intact with a 3-vessel cord.  The uterus was exteriorized and cleared of all clots and debris.  Uterine incision was closed using 0 chromic in a running locked stitch.  The uterus was returned to  the abdomen.  Peritoneum was closed using 0 Vicryl.   The fascia was closed starting in each corner and meeting in the midline.  The subcutaneous was closed with interrupted using plain gut suture.  The skin was closed with 3-0 Vicryl on a Keith needle.  All sponge, lap and instrument counts were correct  x2.  Benzoin, Steri-Strips and a honeycomb dressing were applied.  The patient went to recovery room in stable condition.   PUS D: 06/12/2022 2:27:51 pm T: 06/12/2022 3:26:00 pm  JOB: [id number]/ [id number]

## 2022-06-12 NOTE — Progress Notes (Signed)
Patient consented for c section Or is getting ready

## 2022-06-12 NOTE — H&P (Signed)
Suzanne Shaffer is a 27 y.o. G 2 P 1 at 44 w 1 day presents for c section due to: 1 - chronic hypertension - recently admitted for worsening BPs and ruled out for [facility] then  2 - LGA - last delivery was very traumatic vaginal delivery and this baby greater than 97%  Counseled last hospitalization and has decided on a c section OB History     Gravida  2   Para  1   Term  1   Preterm      AB      Living  1      SAB      IAB      Ectopic      Multiple  0   Live Births  1          Past Medical History:  Diagnosis Date   Anemia    Anxiety    Chronic kidney disease    Frequent UTI's   Pregnancy induced hypertension    Past Surgical History:  Procedure Laterality Date   WISDOM TOOTH EXTRACTION Bilateral    Upper only   Family History: family history includes Healthy in her father and mother. Social History:  reports that she has never smoked. She has never used smokeless tobacco. She reports that she does not currently use alcohol. She reports that she does not use drugs.     Maternal Diabetes: No Genetic Screening: Normal Maternal Ultrasounds/Referrals: Normal Fetal Ultrasounds or other Referrals:  None Maternal Substance Abuse:  No Significant Maternal Medications:  None Significant Maternal Lab Results:  None Number of Prenatal Visits:greater than 3 verified prenatal visits Other Comments:  None  Review of Systems  All other systems reviewed and are negative.  Maternal Medical History:  Prenatal complications: PIH.       unknown if currently breastfeeding. Maternal Exam:  Abdomen: Fetal presentation: vertex   Fetal Exam Fetal State Assessment: Category I - tracings are normal.   Physical Exam Vitals and nursing note reviewed. Exam conducted with a chaperone present.  Constitutional:      Appearance: Normal appearance.  HENT:     Head: Normocephalic.  Cardiovascular:     Rate and Rhythm: Normal rate and regular rhythm.  Pulmonary:      Effort: Pulmonary effort is normal.  Neurological:     Mental Status: She is alert.     Prenatal labs: ABO, Rh: --/--/A POS (05/03 1610) Antibody: NEG (05/03 9604) Rubella: Immune (10/20 0000) RPR: NON REACTIVE (05/03 5409)  HBsAg: Negative (10/20 0000)  HIV: Non-reactive (10/20 0000)  GBS: Negative/-- (04/25 0000)   Assessment/Plan: IUP at 37 1/7 Chronic hypertension LGA Admit  Primary LTCS Risks reviewed Consent is signed   Jeani Hawking 06/12/2022, 8:25 AM

## 2022-06-12 NOTE — Transfer of Care (Signed)
Immediate Anesthesia Transfer of Care Note  Patient: Suzanne Shaffer  Procedure(s) Performed: CESAREAN SECTION  Patient Location: PACU  Anesthesia Type:Spinal  Level of Consciousness: awake, alert , and oriented  Airway & Oxygen Therapy: Patient Spontanous Breathing  Post-op Assessment: Report given to RN and Post -op Vital signs reviewed and stable  Post vital signs: Reviewed and stable  Last Vitals:  Vitals Value Taken Time  BP 124/77 06/12/22 1021  Temp    Pulse 88 06/12/22 1028  Resp 12 06/12/22 1028  SpO2 97 % 06/12/22 1028  Vitals shown include unvalidated device data.  Last Pain:  Vitals:   06/12/22 0825  TempSrc: Oral         Complications: No notable events documented.

## 2022-06-12 NOTE — Anesthesia Postprocedure Evaluation (Signed)
Anesthesia Post Note  Patient: Suzanne Shaffer  Procedure(s) Performed: CESAREAN SECTION     Patient location during evaluation: Mother Baby Anesthesia Type: Spinal Level of consciousness: oriented and awake and alert Pain management: pain level controlled Vital Signs Assessment: post-procedure vital signs reviewed and stable Respiratory status: spontaneous breathing and respiratory function stable Cardiovascular status: blood pressure returned to baseline and stable Postop Assessment: no headache, no backache, no apparent nausea or vomiting and able to ambulate Anesthetic complications: no   No notable events documented.  Last Vitals:  Vitals:   06/12/22 1326 06/12/22 1420  BP: 130/81 126/77  Pulse: 85 88  Resp: 20 16  Temp: 36.6 C 37.1 C  SpO2:      Last Pain:  Vitals:   06/12/22 1421  TempSrc:   PainSc: 1                  [name]

## 2022-06-12 NOTE — Anesthesia Procedure Notes (Signed)
Spinal  Patient location during procedure: OR Start time: 06/12/2022 9:16 AM End time: 06/12/2022 9:18 AM Staffing Performed: anesthesiologist  Anesthesiologist: Atilano Median, DO Performed by: Atilano Median, DO Authorized by: Atilano Median, DO   Preanesthetic Checklist Completed: patient identified, IV checked, site marked, risks and benefits discussed, surgical consent, monitors and equipment checked, pre-op evaluation and timeout performed Spinal Block Patient position: sitting Prep: DuraPrep Patient monitoring: heart rate, cardiac monitor, continuous pulse ox and blood pressure Approach: midline Location: L3-4 Injection technique: single-shot Needle Needle type: Pencan  Needle gauge: 24 G Needle length: 10 cm Assessment Events: CSF return Additional Notes Patient identified. Risks/Benefits/Options discussed with patient including but not limited to bleeding, infection, nerve damage, paralysis, failed block, incomplete pain control, headache, blood pressure changes, nausea, vomiting, reactions to medications, itching and postpartum back pain. Confirmed with bedside nurse the patient's most recent platelet count. Confirmed with patient that they are not currently taking any anticoagulation, have any bleeding history or any family history of bleeding disorders. Patient expressed understanding and wished to proceed. All questions were answered. Sterile technique was used throughout the entire procedure. Please see nursing notes for vital signs. Warning signs of high block given to the patient including shortness of breath, tingling/numbness in hands, complete motor block, or any concerning symptoms with instructions to call for help. Patient was given instructions on fall risk and not to get out of bed. All questions and concerns addressed with instructions to call with any issues or inadequate analgesia.

## 2022-06-12 NOTE — Anesthesia Preprocedure Evaluation (Signed)
Anesthesia Evaluation  Patient identified by MRN, date of birth, ID band Patient awake    Reviewed: Allergy & Precautions, NPO status , Patient's Chart, lab work & pertinent test results  Airway Mallampati: II  TM Distance: >3 FB Neck ROM: Full    Dental no notable dental hx.    Pulmonary neg pulmonary ROS   Pulmonary exam normal        Cardiovascular hypertension, Pt. on medications  Rhythm:Regular Rate:Normal     Neuro/Psych   Anxiety     negative neurological ROS     GI/Hepatic negative GI ROS, Neg liver ROS,,,  Endo/Other  negative endocrine ROS    Renal/GU   negative genitourinary   Musculoskeletal negative musculoskeletal ROS (+)    Abdominal Normal abdominal exam  (+)   Peds  Hematology  (+) Blood dyscrasia, anemia Lab Results      Component                Value               Date                      WBC                      6.3                 06/09/2022                HGB                      10.8 (L)            06/09/2022                HCT                      34.2 (L)            06/09/2022                MCV                      78.4 (L)            06/09/2022                PLT                      217                 06/09/2022              Anesthesia Other Findings   Reproductive/Obstetrics                             Anesthesia Physical Anesthesia Plan  ASA: 2  Anesthesia Plan: Spinal   Post-op Pain Management:    Induction: Intravenous  PONV Risk Score and Plan: 2 and Treatment may vary due to age or medical condition and Ondansetron  Airway Management Planned: Natural Airway and Nasal Cannula  Additional Equipment: None  Intra-op Plan:   Post-operative Plan:   Informed Consent: I have reviewed the patients History and Physical, chart, labs and discussed the procedure including the risks, benefits and alternatives for the proposed anesthesia with the  patient or authorized representative who has indicated his/her understanding  and acceptance.     Dental advisory given  Plan Discussed with: CRNA  Anesthesia Plan Comments: (Lab Results      Component                Value               Date                      WBC                      6.3                 06/09/2022                HGB                      10.8 (L)            06/09/2022                HCT                      34.2 (L)            06/09/2022                MCV                      78.4 (L)            06/09/2022                PLT                      217                 06/09/2022           )       Anesthesia Quick Evaluation

## 2022-06-13 LAB — CBC
HCT: 25.2 % — ABNORMAL LOW (ref 36.0–46.0)
Hemoglobin: 8.1 g/dL — ABNORMAL LOW (ref 12.0–15.0)
MCH: 25.1 pg — ABNORMAL LOW (ref 26.0–34.0)
MCHC: 32.1 g/dL (ref 30.0–36.0)
MCV: 78 fL — ABNORMAL LOW (ref 80.0–100.0)
Platelets: 179 10*3/uL (ref 150–400)
RBC: 3.23 MIL/uL — ABNORMAL LOW (ref 3.87–5.11)
RDW: 15.2 % (ref 11.5–15.5)
WBC: 9.7 10*3/uL (ref 4.0–10.5)
nRBC: 0 % (ref 0.0–0.2)

## 2022-06-13 MED ORDER — IRON SUCROSE 500 MG IVPB - SIMPLE MED
500.0000 mg | Freq: Once | INTRAVENOUS | Status: DC
  Filled 2022-06-13: qty 275

## 2022-06-13 MED ORDER — SODIUM CHLORIDE 0.9 % IV SOLN
500.0000 mg | Freq: Once | INTRAVENOUS | Status: AC
  Administered 2022-06-13: 500 mg via INTRAVENOUS
  Filled 2022-06-13: qty 25

## 2022-06-13 NOTE — Progress Notes (Signed)
[name] was referred for history of anxiety.  * Referral screened out by Clinical Social Worker because none of the following criteria appear to apply:  ~ History of anxiety during this pregnancy, or of post-partum depression following prior delivery.  ~ Diagnosis of anxiety within last 3 years  Per OB note, [name] did not indicate any signs/symptoms during pregnancy.  OR  * [name]'s symptoms currently being treated with medication and/or therapy.  Please contact the Clinical Social Worker if needs arise, by [name] request, or if [name] scores greater than 9/yes to question 10 on Edinburgh Postpartum Depression Screen.  Anelise Staron, LCSWA Clinical Social Worker [phone] 

## 2022-06-13 NOTE — Lactation Note (Addendum)
This note was copied from a baby's chart. Lactation Consultation Note  Patient Name: Boy Cleta Antkowiak [id number] Date: 06/13/2022 Age:26 hours Reason for consult: Initial assessment;Early term 37-38.6wks  P2, [redacted]w[redacted]d.  5 voids/8 stools in the last 24 hours.  Baby cluster fed last night. Baby latched and unlatched when LC was in room. Rotated infant tummy to tummy and provided head support to guide him deep on breast.  Mother states she has a difficulty time with phototherapy with first child and stopped breastfeeding at 3 weeks.  She seems happy this baby is latching better.  Pacifier use not recommended at this time.  Mom made aware of O/P services, breastfeeding support group and our phone # for post-discharge questions.    Maternal Data Does the patient have breastfeeding experience prior to this delivery?: Yes How long did the patient breastfeed?: 3 weeks  Feeding Mother's Current Feeding Choice: Breast Milk  LATCH Score Latch: Grasps breast easily, tongue down, lips flanged, rhythmical sucking.  Audible Swallowing: A few with stimulation  Type of Nipple: Everted at rest and after stimulation  Comfort (Breast/Nipple): Soft / non-tender  Hold (Positioning): Assistance needed to correctly position infant at breast and maintain latch.  LATCH Score: 8  Interventions Interventions: Assisted with latch;Education  Discharge Discharge Education: Engorgement and breast care;Warning signs for feeding baby  Consult Status Consult Status: Follow-up Date: 06/14/22 Follow-up type: In-patient    Dahlia Byes [facility] 06/13/2022, 9:02 AM

## 2022-06-13 NOTE — Progress Notes (Signed)
Subjective: Postpartum Day 1: Cesarean Delivery Patient reports incisional pain, tolerating PO, and no problems voiding.    Objective: Vital signs in last 24 hours: Temp:  [97.2 F (36.2 C)-98.7 F (37.1 C)] 98 F (36.7 C) (05/07 0330) Pulse Rate:  [73-109] 90 (05/07 0330) Resp:  [13-23] 20 (05/07 0330) BP: (112-140)/(68-81) 115/78 (05/07 0330) SpO2:  [95 %-97 %] 97 % (05/06 2230)  Physical Exam:  General: alert, cooperative, appears stated age, and no distress Lochia: appropriate Uterine Fundus: firm Incision: healing well DVT Evaluation: No evidence of DVT seen on physical exam.  Recent Labs    06/13/22 0527  HGB 8.1*  HCT 25.2*    Assessment/Plan: Status post Cesarean section. Postoperative course complicated by acute blood loss anemia with hx of anemia   Continue current care but will give IV Fe infusion Circ today.Suzanne Daniels, MD 06/13/2022, 9:35 AM

## 2022-06-14 MED ORDER — ACETAMINOPHEN 325 MG PO TABS: 650.0000 mg | ORAL_TABLET | ORAL | 0 refills | Status: DC | PRN

## 2022-06-14 MED ORDER — OXYCODONE HCL 5 MG/5ML PO SOLN: 5.0000 mg | Freq: Four times a day (QID) | ORAL | 0 refills | Status: AC | PRN

## 2022-06-14 MED ORDER — OXYCODONE HCL 5 MG/5ML PO SOLN: 5.0000 mg | Freq: Four times a day (QID) | ORAL | 0 refills | Status: DC | PRN

## 2022-06-14 MED ORDER — IBUPROFEN 100 MG/5ML PO SUSP: 600.0000 mg | Freq: Four times a day (QID) | ORAL | 0 refills | Status: DC

## 2022-06-14 MED ORDER — ACETAMINOPHEN 325 MG PO TABS: 650.0000 mg | ORAL_TABLET | ORAL | 0 refills | Status: AC | PRN

## 2022-06-14 MED ORDER — IBUPROFEN 100 MG/5ML PO SUSP: 600.0000 mg | Freq: Four times a day (QID) | ORAL | 0 refills | Status: AC

## 2022-06-14 NOTE — Lactation Note (Signed)
This note was copied from a baby's chart. Lactation Consultation Note  Patient Name: Suzanne Shaffer [id number] Date: 06/14/2022 Age:26 hours  Reason for consult: Breastfeeding assistance;Infant weight loss;Early term 37-38.6wks  P2, GA [redacted]w[redacted]d, 9.5% weight loss  NP asked LC to see mother/baby and assess breastfeeding and establish a feeding plan due to infant's weight loss and mother's desire for discharge to home today.  Infant was latched well to the breast. He was actively sucking with pauses and occasional swallows. Advised mother to do breast compression to improve milk transfer. Infant increased frequency of suck pattern and had bursts of swallows.   Mother's breast are filling and heavier. She reports having a very good milk supply with her last baby and pumping for 6 months. Mother has pumped 5 ml and is getting ready to pump again. Guidelines for supplementation given and explained to parent. Instructed if baby is nutritive with feeding and swallowing is frequently heard, to feed baby  supplemental volumes when breastfeeding (18-20 ml). If baby is sleepy and non-nutritive to give a higher volume (30 plus ml). Mother verbalized understanding.   Latch baby with cues, every 2-3 hrs, 8-12/ 24 hrs Observe for effective feeding (nutritive/non-nutritive) Supplement per guidelines, giving mother's milk first followed by formula Pump every 3 hours for supplement until infant is feeding and gaining with breastfeeding only  Infant has weight check after 4 pm to determine eligibility for discharge.   Maternal Data Has patient been taught Hand Expression?: Yes  Feeding Mother's Current Feeding Choice: Breast Milk Nipple Type: Slow - flow  LATCH Score Latch: Grasps breast easily, tongue down, lips flanged, rhythmical sucking.  Audible Swallowing: A few with stimulation  Type of Nipple: Everted at rest and after stimulation  Comfort (Breast/Nipple): Filling, red/small blisters or bruises,  mild/mod discomfort  Hold (Positioning): No assistance needed to correctly position infant at breast.  LATCH Score: 8   Lactation Tools Discussed/Used Tools: Pump Breast pump type: Double-Electric Breast Pump Pump Education: Other (comment) (RN set up DEBP) Reason for Pumping: supplement with EBM due to weight loss Pumping frequency: every 3 for 15 for supplemental feeding Pumped volume: 5 mL  Interventions Interventions: Breast feeding basics reviewed;Breast massage;Breast compression;Ice;Education  Discharge Discharge Education: Engorgement and breast care;Warning signs for feeding baby;Other (comment) (mother lives out of town, will contact OP LC for appt herself, as needed) Pump: DEBP;Personal  Consult Status Consult Status: Complete Date: 06/14/22    Omar Person 06/14/2022, 3:19 PM

## 2022-06-14 NOTE — Discharge Instructions (Signed)
WHAT TO LOOK OUT FOR: Fever of 100.4 or above Mastitis: feels like flu and breasts hurt Infection: increased pain, swelling or redness Blood clots golf ball size or larger Postpartum depression   Congratulations on your newest addition! 

## 2022-06-14 NOTE — Progress Notes (Signed)
Postpartum Progress Note  Postpartum Day 2 s/p primary Cesarean section.  Subjective:  Patient reports no overnight events.  She reports well controlled pain, ambulating without difficulty, voiding spontaneously, tolerating PO.  She reports Positive flatus, Negative BM.  Vaginal bleeding is minimal.  Objective: Blood pressure 134/84, pulse 94, temperature 98.2 F (36.8 C), temperature source Oral, resp. rate 16, height 5\' 7"  (1.702 m), weight 98.9 kg, SpO2 100 %, unknown if currently breastfeeding.  Physical Exam:  General: alert and no distress Lochia: appropriate Abdomen: soft, ATTP Uterine Fundus: firm Incision: dressing in place DVT Evaluation: No evidence of DVT seen on physical exam.  Recent Labs    06/13/22 0527  HGB 8.1*  HCT 25.2*    Assessment/Plan: Postpartum Day 2, s/p C-section Acute blood loss anemia - s/p IV iron. No signs or symptoms of anemia.  Baby boy - s/p circ cHTN - labetalol discontinued.  Plan for 1 week BP check in office. Lactation following Doing well, continue routine postpartum care. Anticipate discharge home today   LOS: 2 days   Lyn Henri 06/14/2022, 7:42 AM

## 2022-06-15 NOTE — Discharge Summary (Signed)
Obstetric Discharge Summary  Suzanne Shaffer is a 27 y.o. female that presented on 06/12/2022 for scheduled primary C section at 37 weeks for chronic hypertension with worsening blood pressures.  She delivered a viable female infant on 06/12/2022.  Her postpartum course was uncomplicated and on PPD#2, she reported well controlled pain, spontaneous voiding, ambulating without difficulty, and tolerating PO.  She was stable for discharge home on 06/14/2022 with plans for in-office follow up.  Hemoglobin  Date Value Ref Range Status  06/13/2022 8.1 (L) 12.0 - 15.0 g/dL Final   HCT  Date Value Ref Range Status  06/13/2022 25.2 (L) 36.0 - 46.0 % Final    Physical Exam:  General: alert and no distress Lochia: appropriate Uterine Fundus: firm Incision: healing well DVT Evaluation: No evidence of DVT seen on physical exam.  Discharge Diagnoses: Chronic hypertension  Discharge Information: Date: 06/15/2022 Activity: Pelvic rest, as tolerated Diet: routine Medications: Tylenol, motrin, oxycodone Condition: stable Instructions: Refer to practice specific booklet.  Discussed prior to discharge.  Discharge to: Home  Follow-up Information     [place] Follow up.   Why: Please follow up for a 1 week postpartum blood pressure check and a 6 week postpartum visit. Contact information: [address] [place] [address] [phone]                 Newborn Data: Live born female  Birth Weight: 8 lb 3.6 oz (3730 g) APGAR: 9, 9  Newborn Delivery   Birth date/time: 06/12/2022 09:42:31 Delivery type: C-Section, Vacuum Assisted Trial of labor: No C-section categorization: Primary      Home with mother.  Lyn Henri 06/15/2022, 12:05 AM

## 2022-06-22 ENCOUNTER — Telehealth (HOSPITAL_COMMUNITY): Payer: Self-pay | Admitting: *Deleted

## 2022-06-22 NOTE — Telephone Encounter (Signed)
Mom reports feeling good. Incision healing well per mom. No concerns regarding herself at this time. EPDS = 0 in oB office per mom. (hospital score=0) Mom reports baby is well. Feeding, peeing, and pooping without difficulty. Reviewed safe sleep. Mom has no concerns about baby at present.  Duffy Rhody, RN 06-22-2022 at 11:09am

## 2022-07-11 ENCOUNTER — Encounter: Payer: Self-pay | Admitting: Obstetrics and Gynecology
